# Patient Record
Sex: Male | Born: 1935 | Race: Black or African American | Hispanic: No | Smoking: Former smoker
Health system: Southern US, Community
[De-identification: ages and names within clinical notes are randomized; demographics above are authoritative.]

## PROBLEM LIST (undated history)

## (undated) DIAGNOSIS — I219 Acute myocardial infarction, unspecified: Secondary | ICD-10-CM

## (undated) DIAGNOSIS — F528 Other sexual dysfunction not due to a substance or known physiological condition: Secondary | ICD-10-CM

## (undated) DIAGNOSIS — I10 Essential (primary) hypertension: Secondary | ICD-10-CM

## (undated) DIAGNOSIS — N186 End stage renal disease: Secondary | ICD-10-CM

## (undated) DIAGNOSIS — E039 Hypothyroidism, unspecified: Secondary | ICD-10-CM

## (undated) DIAGNOSIS — D539 Nutritional anemia, unspecified: Secondary | ICD-10-CM

## (undated) DIAGNOSIS — M1990 Unspecified osteoarthritis, unspecified site: Secondary | ICD-10-CM

## (undated) DIAGNOSIS — I2510 Atherosclerotic heart disease of native coronary artery without angina pectoris: Secondary | ICD-10-CM

## (undated) DIAGNOSIS — M159 Polyosteoarthritis, unspecified: Secondary | ICD-10-CM

## (undated) DIAGNOSIS — M549 Dorsalgia, unspecified: Secondary | ICD-10-CM

## (undated) DIAGNOSIS — Z9189 Other specified personal risk factors, not elsewhere classified: Secondary | ICD-10-CM

## (undated) HISTORY — DX: Essential (primary) hypertension: I10

## (undated) HISTORY — DX: Polyosteoarthritis, unspecified: M15.9

## (undated) HISTORY — DX: Unspecified osteoarthritis, unspecified site: M19.90

## (undated) HISTORY — DX: Atherosclerotic heart disease of native coronary artery without angina pectoris: I25.10

## (undated) HISTORY — PX: AV FISTULA PLACEMENT: SHX1204

## (undated) HISTORY — PX: OTHER SURGICAL HISTORY: SHX169

## (undated) HISTORY — DX: Acute myocardial infarction, unspecified: I21.9

## (undated) HISTORY — PX: CORONARY ARTERY BYPASS GRAFT: SHX141

## (undated) HISTORY — DX: Nutritional anemia, unspecified: D53.9

## (undated) HISTORY — DX: End stage renal disease: N18.6

## (undated) HISTORY — DX: Hypothyroidism, unspecified: E03.9

## (undated) HISTORY — DX: Other specified personal risk factors, not elsewhere classified: Z91.89

## (undated) HISTORY — DX: Other sexual dysfunction not due to a substance or known physiological condition: F52.8

---

## 1962-08-10 DIAGNOSIS — I219 Acute myocardial infarction, unspecified: Secondary | ICD-10-CM

## 1962-08-10 HISTORY — DX: Acute myocardial infarction, unspecified: I21.9

## 2002-08-22 ENCOUNTER — Encounter: Payer: Self-pay | Admitting: Emergency Medicine

## 2002-08-22 ENCOUNTER — Emergency Department (HOSPITAL_COMMUNITY): Admission: EM | Admit: 2002-08-22 | Discharge: 2002-08-22 | Payer: Self-pay | Admitting: Emergency Medicine

## 2002-09-15 ENCOUNTER — Encounter: Admission: RE | Admit: 2002-09-15 | Discharge: 2002-09-15 | Payer: Self-pay | Admitting: Nephrology

## 2002-09-15 ENCOUNTER — Encounter: Payer: Self-pay | Admitting: Nephrology

## 2002-09-30 ENCOUNTER — Emergency Department (HOSPITAL_COMMUNITY): Admission: EM | Admit: 2002-09-30 | Discharge: 2002-09-30 | Payer: Self-pay | Admitting: *Deleted

## 2002-09-30 ENCOUNTER — Encounter: Payer: Self-pay | Admitting: *Deleted

## 2002-10-09 ENCOUNTER — Ambulatory Visit (HOSPITAL_COMMUNITY): Admission: RE | Admit: 2002-10-09 | Discharge: 2002-10-09 | Payer: Self-pay | Admitting: Cardiology

## 2002-10-09 ENCOUNTER — Encounter: Payer: Self-pay | Admitting: Cardiology

## 2002-10-24 ENCOUNTER — Encounter: Payer: Self-pay | Admitting: Emergency Medicine

## 2002-10-24 ENCOUNTER — Emergency Department (HOSPITAL_COMMUNITY): Admission: EM | Admit: 2002-10-24 | Discharge: 2002-10-24 | Payer: Self-pay | Admitting: Emergency Medicine

## 2003-01-14 ENCOUNTER — Emergency Department (HOSPITAL_COMMUNITY): Admission: EM | Admit: 2003-01-14 | Discharge: 2003-01-14 | Payer: Self-pay | Admitting: Emergency Medicine

## 2003-01-24 ENCOUNTER — Ambulatory Visit (HOSPITAL_COMMUNITY): Admission: RE | Admit: 2003-01-24 | Discharge: 2003-01-24 | Payer: Self-pay | Admitting: Nephrology

## 2003-01-24 ENCOUNTER — Encounter: Payer: Self-pay | Admitting: Nephrology

## 2003-07-09 ENCOUNTER — Ambulatory Visit (HOSPITAL_COMMUNITY): Admission: RE | Admit: 2003-07-09 | Discharge: 2003-07-09 | Payer: Self-pay | Admitting: Vascular Surgery

## 2003-11-01 ENCOUNTER — Ambulatory Visit (HOSPITAL_COMMUNITY): Admission: RE | Admit: 2003-11-01 | Discharge: 2003-11-01 | Payer: Self-pay | Admitting: Nephrology

## 2003-12-07 ENCOUNTER — Inpatient Hospital Stay (HOSPITAL_COMMUNITY): Admission: EM | Admit: 2003-12-07 | Discharge: 2003-12-11 | Payer: Self-pay

## 2003-12-10 ENCOUNTER — Encounter: Payer: Self-pay | Admitting: Cardiology

## 2003-12-26 ENCOUNTER — Emergency Department (HOSPITAL_COMMUNITY): Admission: EM | Admit: 2003-12-26 | Discharge: 2003-12-26 | Payer: Self-pay | Admitting: Emergency Medicine

## 2004-01-02 ENCOUNTER — Ambulatory Visit (HOSPITAL_COMMUNITY): Admission: RE | Admit: 2004-01-02 | Discharge: 2004-01-02 | Payer: Self-pay | Admitting: Vascular Surgery

## 2004-03-04 ENCOUNTER — Emergency Department (HOSPITAL_COMMUNITY): Admission: EM | Admit: 2004-03-04 | Discharge: 2004-03-04 | Payer: Self-pay | Admitting: Emergency Medicine

## 2004-07-28 ENCOUNTER — Emergency Department (HOSPITAL_COMMUNITY): Admission: EM | Admit: 2004-07-28 | Discharge: 2004-07-28 | Payer: Self-pay | Admitting: Emergency Medicine

## 2005-05-29 ENCOUNTER — Emergency Department (HOSPITAL_COMMUNITY): Admission: EM | Admit: 2005-05-29 | Discharge: 2005-05-29 | Payer: Self-pay | Admitting: Emergency Medicine

## 2005-12-05 ENCOUNTER — Emergency Department (HOSPITAL_COMMUNITY): Admission: EM | Admit: 2005-12-05 | Discharge: 2005-12-05 | Payer: Self-pay | Admitting: Family Medicine

## 2005-12-13 ENCOUNTER — Inpatient Hospital Stay (HOSPITAL_COMMUNITY): Admission: AC | Admit: 2005-12-13 | Discharge: 2005-12-28 | Payer: Self-pay

## 2006-01-01 ENCOUNTER — Ambulatory Visit (HOSPITAL_COMMUNITY): Admission: RE | Admit: 2006-01-01 | Discharge: 2006-01-01 | Payer: Self-pay | Admitting: Nephrology

## 2006-01-29 ENCOUNTER — Ambulatory Visit (HOSPITAL_COMMUNITY): Admission: RE | Admit: 2006-01-29 | Discharge: 2006-01-29 | Payer: Self-pay | Admitting: Vascular Surgery

## 2006-02-10 ENCOUNTER — Emergency Department (HOSPITAL_COMMUNITY): Admission: EM | Admit: 2006-02-10 | Discharge: 2006-02-10 | Payer: Self-pay | Admitting: Emergency Medicine

## 2007-04-04 ENCOUNTER — Encounter: Admission: RE | Admit: 2007-04-04 | Discharge: 2007-04-04 | Payer: Self-pay | Admitting: Nephrology

## 2007-04-07 ENCOUNTER — Ambulatory Visit (HOSPITAL_COMMUNITY): Admission: RE | Admit: 2007-04-07 | Discharge: 2007-04-07 | Payer: Self-pay | Admitting: Cardiology

## 2007-04-07 ENCOUNTER — Other Ambulatory Visit: Payer: Self-pay | Admitting: Cardiology

## 2007-07-06 ENCOUNTER — Emergency Department (HOSPITAL_COMMUNITY): Admission: EM | Admit: 2007-07-06 | Discharge: 2007-07-06 | Payer: Self-pay | Admitting: Emergency Medicine

## 2007-07-20 ENCOUNTER — Emergency Department (HOSPITAL_COMMUNITY): Admission: EM | Admit: 2007-07-20 | Discharge: 2007-07-20 | Payer: Self-pay | Admitting: Emergency Medicine

## 2007-08-22 ENCOUNTER — Ambulatory Visit (HOSPITAL_COMMUNITY): Admission: RE | Admit: 2007-08-22 | Discharge: 2007-08-22 | Payer: Self-pay | Admitting: Nephrology

## 2007-10-21 ENCOUNTER — Ambulatory Visit: Payer: Self-pay | Admitting: Internal Medicine

## 2008-01-30 ENCOUNTER — Emergency Department (HOSPITAL_COMMUNITY): Admission: EM | Admit: 2008-01-30 | Discharge: 2008-01-30 | Payer: Self-pay | Admitting: Emergency Medicine

## 2008-04-30 ENCOUNTER — Emergency Department (HOSPITAL_COMMUNITY): Admission: EM | Admit: 2008-04-30 | Discharge: 2008-04-30 | Payer: Self-pay | Admitting: Emergency Medicine

## 2008-06-06 ENCOUNTER — Inpatient Hospital Stay (HOSPITAL_BASED_OUTPATIENT_CLINIC_OR_DEPARTMENT_OTHER): Admission: RE | Admit: 2008-06-06 | Discharge: 2008-06-06 | Payer: Self-pay | Admitting: Cardiology

## 2008-06-27 ENCOUNTER — Emergency Department (HOSPITAL_COMMUNITY): Admission: EM | Admit: 2008-06-27 | Discharge: 2008-06-27 | Payer: Self-pay | Admitting: Emergency Medicine

## 2008-07-11 ENCOUNTER — Emergency Department (HOSPITAL_COMMUNITY): Admission: EM | Admit: 2008-07-11 | Discharge: 2008-07-11 | Payer: Self-pay | Admitting: Emergency Medicine

## 2008-07-18 ENCOUNTER — Ambulatory Visit: Payer: Self-pay | Admitting: Vascular Surgery

## 2008-07-25 ENCOUNTER — Emergency Department (HOSPITAL_COMMUNITY): Admission: EM | Admit: 2008-07-25 | Discharge: 2008-07-25 | Payer: Self-pay | Admitting: Family Medicine

## 2008-07-25 ENCOUNTER — Ambulatory Visit (HOSPITAL_COMMUNITY): Admission: RE | Admit: 2008-07-25 | Discharge: 2008-07-25 | Payer: Self-pay | Admitting: Vascular Surgery

## 2008-07-29 ENCOUNTER — Emergency Department (HOSPITAL_COMMUNITY): Admission: EM | Admit: 2008-07-29 | Discharge: 2008-07-29 | Payer: Self-pay | Admitting: Emergency Medicine

## 2008-07-30 ENCOUNTER — Ambulatory Visit (HOSPITAL_COMMUNITY): Admission: RE | Admit: 2008-07-30 | Discharge: 2008-07-30 | Payer: Self-pay | Admitting: Vascular Surgery

## 2008-07-30 ENCOUNTER — Ambulatory Visit: Payer: Self-pay | Admitting: Vascular Surgery

## 2008-08-03 ENCOUNTER — Emergency Department (HOSPITAL_COMMUNITY): Admission: EM | Admit: 2008-08-03 | Discharge: 2008-08-03 | Payer: Self-pay | Admitting: Emergency Medicine

## 2008-08-08 ENCOUNTER — Inpatient Hospital Stay (HOSPITAL_COMMUNITY): Admission: EM | Admit: 2008-08-08 | Discharge: 2008-08-09 | Payer: Self-pay | Admitting: Emergency Medicine

## 2008-09-05 ENCOUNTER — Ambulatory Visit: Payer: Self-pay | Admitting: Vascular Surgery

## 2008-09-19 ENCOUNTER — Ambulatory Visit: Payer: Self-pay | Admitting: Vascular Surgery

## 2008-10-15 ENCOUNTER — Ambulatory Visit (HOSPITAL_COMMUNITY): Admission: RE | Admit: 2008-10-15 | Discharge: 2008-10-15 | Payer: Self-pay | Admitting: Vascular Surgery

## 2008-10-15 ENCOUNTER — Ambulatory Visit: Payer: Self-pay | Admitting: Vascular Surgery

## 2008-10-31 ENCOUNTER — Emergency Department (HOSPITAL_COMMUNITY): Admission: EM | Admit: 2008-10-31 | Discharge: 2008-11-01 | Payer: Self-pay | Admitting: Emergency Medicine

## 2008-11-02 ENCOUNTER — Encounter: Payer: Self-pay | Admitting: Internal Medicine

## 2008-11-16 ENCOUNTER — Encounter: Payer: Self-pay | Admitting: Internal Medicine

## 2008-11-22 DIAGNOSIS — N186 End stage renal disease: Secondary | ICD-10-CM

## 2008-11-22 DIAGNOSIS — M1990 Unspecified osteoarthritis, unspecified site: Secondary | ICD-10-CM | POA: Insufficient documentation

## 2008-11-22 DIAGNOSIS — F528 Other sexual dysfunction not due to a substance or known physiological condition: Secondary | ICD-10-CM

## 2008-11-22 DIAGNOSIS — Z8639 Personal history of other endocrine, nutritional and metabolic disease: Secondary | ICD-10-CM

## 2008-11-22 DIAGNOSIS — M159 Polyosteoarthritis, unspecified: Secondary | ICD-10-CM

## 2008-11-22 DIAGNOSIS — Z9189 Other specified personal risk factors, not elsewhere classified: Secondary | ICD-10-CM

## 2008-11-22 DIAGNOSIS — I2510 Atherosclerotic heart disease of native coronary artery without angina pectoris: Secondary | ICD-10-CM | POA: Insufficient documentation

## 2008-11-22 DIAGNOSIS — D539 Nutritional anemia, unspecified: Secondary | ICD-10-CM | POA: Insufficient documentation

## 2008-11-22 DIAGNOSIS — I10 Essential (primary) hypertension: Secondary | ICD-10-CM | POA: Insufficient documentation

## 2008-11-22 DIAGNOSIS — Z862 Personal history of diseases of the blood and blood-forming organs and certain disorders involving the immune mechanism: Secondary | ICD-10-CM

## 2008-11-22 DIAGNOSIS — E039 Hypothyroidism, unspecified: Secondary | ICD-10-CM | POA: Insufficient documentation

## 2008-11-26 ENCOUNTER — Ambulatory Visit: Payer: Self-pay | Admitting: Internal Medicine

## 2008-12-07 ENCOUNTER — Emergency Department (HOSPITAL_COMMUNITY): Admission: EM | Admit: 2008-12-07 | Discharge: 2008-12-07 | Payer: Self-pay | Admitting: Emergency Medicine

## 2008-12-14 ENCOUNTER — Ambulatory Visit: Payer: Self-pay | Admitting: Internal Medicine

## 2008-12-14 DIAGNOSIS — R0609 Other forms of dyspnea: Secondary | ICD-10-CM

## 2008-12-14 DIAGNOSIS — R0989 Other specified symptoms and signs involving the circulatory and respiratory systems: Secondary | ICD-10-CM

## 2009-01-09 ENCOUNTER — Emergency Department (HOSPITAL_COMMUNITY): Admission: EM | Admit: 2009-01-09 | Discharge: 2009-01-09 | Payer: Self-pay | Admitting: Emergency Medicine

## 2009-03-31 ENCOUNTER — Emergency Department (HOSPITAL_COMMUNITY): Admission: EM | Admit: 2009-03-31 | Discharge: 2009-03-31 | Payer: Self-pay | Admitting: Emergency Medicine

## 2009-04-01 ENCOUNTER — Emergency Department (HOSPITAL_COMMUNITY): Admission: EM | Admit: 2009-04-01 | Discharge: 2009-04-01 | Payer: Self-pay | Admitting: Emergency Medicine

## 2009-04-08 ENCOUNTER — Emergency Department (HOSPITAL_COMMUNITY): Admission: EM | Admit: 2009-04-08 | Discharge: 2009-04-08 | Payer: Self-pay | Admitting: Emergency Medicine

## 2009-05-01 ENCOUNTER — Ambulatory Visit (HOSPITAL_COMMUNITY): Admission: RE | Admit: 2009-05-01 | Discharge: 2009-05-01 | Payer: Self-pay | Admitting: Nephrology

## 2009-08-11 ENCOUNTER — Emergency Department (HOSPITAL_COMMUNITY): Admission: EM | Admit: 2009-08-11 | Discharge: 2009-08-11 | Payer: Self-pay | Admitting: Emergency Medicine

## 2009-08-31 ENCOUNTER — Inpatient Hospital Stay (HOSPITAL_COMMUNITY): Admission: EM | Admit: 2009-08-31 | Discharge: 2009-09-05 | Payer: Self-pay | Admitting: Emergency Medicine

## 2009-10-09 ENCOUNTER — Emergency Department (HOSPITAL_COMMUNITY): Admission: EM | Admit: 2009-10-09 | Discharge: 2009-10-09 | Payer: Self-pay | Admitting: Emergency Medicine

## 2009-10-13 ENCOUNTER — Observation Stay (HOSPITAL_COMMUNITY): Admission: EM | Admit: 2009-10-13 | Discharge: 2009-10-15 | Payer: Self-pay | Admitting: Emergency Medicine

## 2009-11-10 ENCOUNTER — Emergency Department (HOSPITAL_COMMUNITY): Admission: EM | Admit: 2009-11-10 | Discharge: 2009-11-11 | Payer: Self-pay | Admitting: Emergency Medicine

## 2010-03-25 ENCOUNTER — Emergency Department (HOSPITAL_COMMUNITY): Admission: EM | Admit: 2010-03-25 | Discharge: 2010-03-25 | Payer: Self-pay | Admitting: Emergency Medicine

## 2010-04-21 ENCOUNTER — Emergency Department (HOSPITAL_COMMUNITY): Admission: EM | Admit: 2010-04-21 | Discharge: 2010-04-21 | Payer: Self-pay | Admitting: Emergency Medicine

## 2010-07-03 ENCOUNTER — Emergency Department (HOSPITAL_COMMUNITY): Admission: EM | Admit: 2010-07-03 | Discharge: 2010-07-03 | Payer: Self-pay | Admitting: Emergency Medicine

## 2010-07-06 ENCOUNTER — Emergency Department (HOSPITAL_COMMUNITY): Admission: EM | Admit: 2010-07-06 | Discharge: 2010-07-06 | Payer: Self-pay | Admitting: Emergency Medicine

## 2010-08-23 ENCOUNTER — Emergency Department (HOSPITAL_COMMUNITY)
Admission: EM | Admit: 2010-08-23 | Discharge: 2010-08-23 | Payer: Self-pay | Source: Home / Self Care | Admitting: Emergency Medicine

## 2010-09-10 ENCOUNTER — Emergency Department (HOSPITAL_COMMUNITY): Payer: Medicaid Other

## 2010-09-10 ENCOUNTER — Emergency Department (HOSPITAL_COMMUNITY): Payer: Medicare Other

## 2010-09-10 ENCOUNTER — Inpatient Hospital Stay (HOSPITAL_COMMUNITY)
Admission: EM | Admit: 2010-09-10 | Discharge: 2010-09-16 | DRG: 492 | Disposition: A | Discharge status: Skilled Nursing Facility | Payer: Medicare Other | Attending: Internal Medicine | Admitting: Internal Medicine

## 2010-09-10 DIAGNOSIS — S82109A Unspecified fracture of upper end of unspecified tibia, initial encounter for closed fracture: Principal | ICD-10-CM | POA: Diagnosis present

## 2010-09-10 DIAGNOSIS — Y998 Other external cause status: Secondary | ICD-10-CM

## 2010-09-10 DIAGNOSIS — E875 Hyperkalemia: Secondary | ICD-10-CM | POA: Diagnosis present

## 2010-09-10 DIAGNOSIS — E039 Hypothyroidism, unspecified: Secondary | ICD-10-CM | POA: Diagnosis present

## 2010-09-10 DIAGNOSIS — R031 Nonspecific low blood-pressure reading: Secondary | ICD-10-CM | POA: Diagnosis present

## 2010-09-10 DIAGNOSIS — I2510 Atherosclerotic heart disease of native coronary artery without angina pectoris: Secondary | ICD-10-CM | POA: Diagnosis present

## 2010-09-10 DIAGNOSIS — M25469 Effusion, unspecified knee: Secondary | ICD-10-CM | POA: Diagnosis present

## 2010-09-10 DIAGNOSIS — N2581 Secondary hyperparathyroidism of renal origin: Secondary | ICD-10-CM | POA: Diagnosis present

## 2010-09-10 DIAGNOSIS — N186 End stage renal disease: Secondary | ICD-10-CM | POA: Diagnosis present

## 2010-09-10 DIAGNOSIS — Z992 Dependence on renal dialysis: Secondary | ICD-10-CM

## 2010-09-10 DIAGNOSIS — D62 Acute posthemorrhagic anemia: Secondary | ICD-10-CM | POA: Diagnosis not present

## 2010-09-10 DIAGNOSIS — S8010XA Contusion of unspecified lower leg, initial encounter: Secondary | ICD-10-CM | POA: Diagnosis present

## 2010-09-10 DIAGNOSIS — IMO0002 Reserved for concepts with insufficient information to code with codable children: Secondary | ICD-10-CM

## 2010-09-10 DIAGNOSIS — S060X0A Concussion without loss of consciousness, initial encounter: Secondary | ICD-10-CM | POA: Diagnosis present

## 2010-09-10 DIAGNOSIS — Z951 Presence of aortocoronary bypass graft: Secondary | ICD-10-CM

## 2010-09-10 DIAGNOSIS — I120 Hypertensive chronic kidney disease with stage 5 chronic kidney disease or end stage renal disease: Secondary | ICD-10-CM | POA: Diagnosis present

## 2010-09-10 DIAGNOSIS — K219 Gastro-esophageal reflux disease without esophagitis: Secondary | ICD-10-CM | POA: Diagnosis present

## 2010-09-10 DIAGNOSIS — I959 Hypotension, unspecified: Secondary | ICD-10-CM | POA: Diagnosis present

## 2010-09-10 LAB — CBC
MCH: 31.3 pg (ref 26.0–34.0)
MCV: 97.1 fL (ref 78.0–100.0)
Platelets: 184 10*3/uL (ref 150–400)
RBC: 3.1 MIL/uL — ABNORMAL LOW (ref 4.22–5.81)
RDW: 13.6 % (ref 11.5–15.5)
WBC: 6.5 10*3/uL (ref 4.0–10.5)

## 2010-09-10 LAB — BASIC METABOLIC PANEL
BUN: 40 mg/dL — ABNORMAL HIGH (ref 6–23)
CO2: 25 mEq/L (ref 19–32)
Chloride: 102 mEq/L (ref 96–112)
GFR calc non Af Amer: 5 mL/min — ABNORMAL LOW (ref >60)
Glucose, Bld: 111 mg/dL — ABNORMAL HIGH (ref 70–99)
Potassium: 5.3 mEq/L — ABNORMAL HIGH (ref 3.5–5.1)
Sodium: 140 mEq/L (ref 135–145)

## 2010-09-10 LAB — ABO/RH: ABO/RH(D): O POS

## 2010-09-10 LAB — APTT: aPTT: 33 seconds (ref 24–37)

## 2010-09-11 ENCOUNTER — Emergency Department (HOSPITAL_COMMUNITY): Payer: Medicare Other

## 2010-09-11 ENCOUNTER — Inpatient Hospital Stay (HOSPITAL_COMMUNITY): Payer: Medicare Other

## 2010-09-11 LAB — CBC
Hemoglobin: 7.7 g/dL — ABNORMAL LOW (ref 13.0–17.0)
Hemoglobin: 7.7 g/dL — ABNORMAL LOW (ref 13.0–17.0)
MCHC: 33.2 g/dL (ref 30.0–36.0)
MCV: 95 fL (ref 78.0–100.0)
Platelets: 146 10*3/uL — ABNORMAL LOW (ref 150–400)
RBC: 2.4 MIL/uL — ABNORMAL LOW (ref 4.22–5.81)
RDW: 13.7 % (ref 11.5–15.5)
WBC: 11.2 10*3/uL — ABNORMAL HIGH (ref 4.0–10.5)
WBC: 9.8 10*3/uL (ref 4.0–10.5)

## 2010-09-11 LAB — POTASSIUM: Potassium: 5.3 mEq/L — ABNORMAL HIGH (ref 3.5–5.1)

## 2010-09-11 LAB — BASIC METABOLIC PANEL
Calcium: 7.8 mg/dL — ABNORMAL LOW (ref 8.4–10.5)
GFR calc non Af Amer: 4 mL/min — ABNORMAL LOW (ref >60)
Glucose, Bld: 111 mg/dL — ABNORMAL HIGH (ref 70–99)
Sodium: 138 mEq/L (ref 135–145)

## 2010-09-11 LAB — PROTIME-INR: Prothrombin Time: 15 seconds (ref 11.6–15.2)

## 2010-09-11 LAB — MRSA PCR SCREENING: MRSA by PCR: POSITIVE — AB

## 2010-09-11 LAB — GLUCOSE, CAPILLARY: Glucose-Capillary: 72 mg/dL (ref 70–99)

## 2010-09-11 MED ORDER — IOHEXOL 300 MG/ML  SOLN
100.0000 mL | Freq: Once | INTRAMUSCULAR | Status: AC | PRN
  Administered 2010-09-11: 100 mL via INTRAVENOUS
  Filled 2010-09-11: qty 100

## 2010-09-12 ENCOUNTER — Inpatient Hospital Stay (HOSPITAL_COMMUNITY): Payer: Medicare Other

## 2010-09-12 LAB — PROTIME-INR
INR: 1.19 (ref 0.00–1.49)
Prothrombin Time: 15.3 seconds — ABNORMAL HIGH (ref 11.6–15.2)

## 2010-09-12 LAB — CBC
MCH: 30.7 pg (ref 26.0–34.0)
Platelets: 123 10*3/uL — ABNORMAL LOW (ref 150–400)
RBC: 2.64 MIL/uL — ABNORMAL LOW (ref 4.22–5.81)
WBC: 9.8 10*3/uL (ref 4.0–10.5)

## 2010-09-12 LAB — RENAL FUNCTION PANEL
Calcium: 8.3 mg/dL — ABNORMAL LOW (ref 8.4–10.5)
Glucose, Bld: 81 mg/dL (ref 70–99)
Phosphorus: 6.1 mg/dL — ABNORMAL HIGH (ref 2.3–4.6)
Sodium: 138 mEq/L (ref 135–145)

## 2010-09-13 ENCOUNTER — Inpatient Hospital Stay (HOSPITAL_COMMUNITY): Payer: Medicare Other

## 2010-09-13 LAB — CBC
MCH: 30.7 pg (ref 26.0–34.0)
MCV: 90.4 fL (ref 78.0–100.0)
Platelets: 138 10*3/uL — ABNORMAL LOW (ref 150–400)
RDW: 16.3 % — ABNORMAL HIGH (ref 11.5–15.5)
WBC: 8.3 10*3/uL (ref 4.0–10.5)

## 2010-09-13 LAB — RENAL FUNCTION PANEL
Albumin: 2.7 g/dL — ABNORMAL LOW (ref 3.5–5.2)
BUN: 30 mg/dL — ABNORMAL HIGH (ref 6–23)
Creatinine, Ser: 10.14 mg/dL — ABNORMAL HIGH (ref 0.4–1.5)
Phosphorus: 4.7 mg/dL — ABNORMAL HIGH (ref 2.3–4.6)

## 2010-09-14 LAB — TYPE AND SCREEN
ABO/RH(D): O POS
Unit division: 0
Unit division: 0

## 2010-09-15 ENCOUNTER — Ambulatory Visit
Payer: Medicare Other | Attending: Rehabilitative and Restorative Service Providers" | Admitting: Rehabilitative and Restorative Service Providers"

## 2010-09-15 LAB — RENAL FUNCTION PANEL
BUN: 38 mg/dL — ABNORMAL HIGH (ref 6–23)
Calcium: 8.4 mg/dL (ref 8.4–10.5)
Glucose, Bld: 94 mg/dL (ref 70–99)
Phosphorus: 5.4 mg/dL — ABNORMAL HIGH (ref 2.3–4.6)

## 2010-09-15 LAB — CBC
HCT: 26.5 % — ABNORMAL LOW (ref 39.0–52.0)
MCHC: 33.2 g/dL (ref 30.0–36.0)
MCV: 91.4 fL (ref 78.0–100.0)
RDW: 15.6 % — ABNORMAL HIGH (ref 11.5–15.5)

## 2010-09-15 LAB — POCT I-STAT 4, (NA,K, GLUC, HGB,HCT)
Hemoglobin: 7.8 g/dL — ABNORMAL LOW (ref 13.0–17.0)
Potassium: 5.4 meq/L — ABNORMAL HIGH (ref 3.5–5.1)

## 2010-09-16 ENCOUNTER — Inpatient Hospital Stay (HOSPITAL_COMMUNITY): Payer: Medicare Other

## 2010-09-16 LAB — BASIC METABOLIC PANEL
GFR calc Af Amer: 5 mL/min — ABNORMAL LOW (ref >60)
GFR calc non Af Amer: 4 mL/min — ABNORMAL LOW (ref >60)
Glucose, Bld: 106 mg/dL — ABNORMAL HIGH (ref 70–99)
Potassium: 4.7 mEq/L (ref 3.5–5.1)
Sodium: 134 mEq/L — ABNORMAL LOW (ref 135–145)

## 2010-09-16 LAB — CBC
HCT: 25 % — ABNORMAL LOW (ref 39.0–52.0)
Hemoglobin: 8.4 g/dL — ABNORMAL LOW (ref 13.0–17.0)
RDW: 15.1 % (ref 11.5–15.5)
WBC: 8.4 10*3/uL (ref 4.0–10.5)

## 2010-09-16 LAB — IRON AND TIBC
Saturation Ratios: 13 % — ABNORMAL LOW (ref 20–55)
TIBC: 160 ug/dL — ABNORMAL LOW (ref 215–435)
UIBC: 140 ug/dL
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
## 2010-09-25 ENCOUNTER — Encounter: Payer: Self-pay | Admitting: Gastroenterology

## 2010-10-01 NOTE — Letter (Signed)
Summary: New Patient letter  [facility]  [address] [place], [place] [address]   Phone: [phone]  Fax: [phone]       09/25/2010 MRN: [id number]  Jared [name] [address] [place], [place]  [address]  Dear Mr. Jared [name],  Welcome to the Gastroenterology Division at [facility].    You are scheduled to see Dr.  Christella Hartigan on 11-07-10 at 10:15am on the 3rd floor at [facility], [address]  We ask that you try to arrive at our office 15 minutes prior to your appointment time to allow for check-in.  We would like you to complete the enclosed self-administered evaluation form prior to your visit and bring it with you on the day of your appointment.  We will review it with you.  Also, please bring a complete list of all your medications or, if you prefer, bring the medication bottles and we will list them.  Please bring your insurance card so that we may make a copy of it.  If your insurance requires a referral to see a specialist, please bring your referral form from your primary care physician.  Co-payments are due at the time of your visit and may be paid by cash, check or credit card.     Your office visit will consist of a consult with your physician (includes a physical exam), any laboratory testing he/she may order, scheduling of any necessary diagnostic testing (e.g. x-ray, ultrasound, CT-scan), and scheduling of a procedure (e.g. Endoscopy, Colonoscopy) if required.  Please allow enough time on your schedule to allow for any/all of these possibilities.    If you cannot keep your appointment, please call [phone] to cancel or reschedule prior to your appointment date.  This allows [place] the opportunity to schedule an appointment for another patient in need of care.  If you do not cancel or reschedule by 5 p.m. the business day prior to your appointment date, you will be charged a $50.00 late cancellation/no-show fee.    Thank you for choosing [facility] for your medical needs.  We appreciate the opportunity to care for you.  Please visit [place] at our website  to learn more about our practice.                     Sincerely,                                                             The Gastroenterology Division
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
## 2010-10-21 LAB — POCT CARDIAC MARKERS
CKMB, poc: 1.3 ng/mL (ref 1.0–8.0)
CKMB, poc: 2 ng/mL (ref 1.0–8.0)
Troponin i, poc: <0.05 ng/mL (ref 0.00–0.09)
Troponin i, poc: <0.05 ng/mL (ref 0.00–0.09)

## 2010-10-21 LAB — POCT I-STAT, CHEM 8
BUN: 33 mg/dL — ABNORMAL HIGH (ref 6–23)
Calcium, Ion: 1.09 mmol/L — ABNORMAL LOW (ref 1.12–1.32)
Chloride: 100 mEq/L (ref 96–112)
Chloride: 102 mEq/L (ref 96–112)
Glucose, Bld: 71 mg/dL (ref 70–99)
HCT: 38 % — ABNORMAL LOW (ref 39.0–52.0)
Hemoglobin: 12.9 g/dL — ABNORMAL LOW (ref 13.0–17.0)
Potassium: 4.4 mEq/L (ref 3.5–5.1)
Potassium: 4.7 mEq/L (ref 3.5–5.1)
Sodium: 140 mEq/L (ref 135–145)

## 2010-10-21 LAB — DIFFERENTIAL
Basophils Absolute: 0.1 10*3/uL (ref 0.0–0.1)
Basophils Relative: 1 % (ref 0–1)
Eosinophils Absolute: 0.4 10*3/uL (ref 0.0–0.7)
Neutro Abs: 3.2 10*3/uL (ref 1.7–7.7)
Neutrophils Relative %: 58 % (ref 43–77)

## 2010-10-21 LAB — CBC
Hemoglobin: 11.9 g/dL — ABNORMAL LOW (ref 13.0–17.0)
MCHC: 32.9 g/dL (ref 30.0–36.0)
Platelets: 177 10*3/uL (ref 150–400)
RDW: 14.2 % (ref 11.5–15.5)

## 2010-10-26 LAB — POCT I-STAT, CHEM 8
BUN: 30 mg/dL — ABNORMAL HIGH (ref 6–23)
Calcium, Ion: 0.97 mmol/L — ABNORMAL LOW (ref 1.12–1.32)
Chloride: 100 mEq/L (ref 96–112)
HCT: 40 % (ref 39.0–52.0)
Sodium: 140 mEq/L (ref 135–145)
TCO2: 36 mmol/L (ref 0–100)

## 2010-10-26 LAB — BASIC METABOLIC PANEL
BUN: 25 mg/dL — ABNORMAL HIGH (ref 6–23)
BUN: 38 mg/dL — ABNORMAL HIGH (ref 6–23)
CO2: 25 mEq/L (ref 19–32)
CO2: 28 mEq/L (ref 19–32)
Chloride: 100 mEq/L (ref 96–112)
Chloride: 95 mEq/L — ABNORMAL LOW (ref 96–112)
Chloride: 97 mEq/L (ref 96–112)
Creatinine, Ser: 12.85 mg/dL — ABNORMAL HIGH (ref 0.4–1.5)
Creatinine, Ser: 8.94 mg/dL — ABNORMAL HIGH (ref 0.4–1.5)
GFR calc Af Amer: 4 mL/min — ABNORMAL LOW (ref >60)
Glucose, Bld: 105 mg/dL — ABNORMAL HIGH (ref 70–99)
Glucose, Bld: 83 mg/dL (ref 70–99)
Sodium: 138 mEq/L (ref 135–145)

## 2010-10-26 LAB — URINALYSIS, ROUTINE W REFLEX MICROSCOPIC
Glucose, UA: NEGATIVE mg/dL
Ketones, ur: NEGATIVE mg/dL
Leukocytes, UA: NEGATIVE
Nitrite: NEGATIVE
Specific Gravity, Urine: 1.019 (ref 1.005–1.030)
pH: 8 (ref 5.0–8.0)

## 2010-10-26 LAB — CBC
HCT: 33.4 % — ABNORMAL LOW (ref 39.0–52.0)
HCT: 33.8 % — ABNORMAL LOW (ref 39.0–52.0)
HCT: 36 % — ABNORMAL LOW (ref 39.0–52.0)
Hemoglobin: 11 g/dL — ABNORMAL LOW (ref 13.0–17.0)
Hemoglobin: 12.3 g/dL — ABNORMAL LOW (ref 13.0–17.0)
Hemoglobin: 12.4 g/dL — ABNORMAL LOW (ref 13.0–17.0)
MCHC: 33.1 g/dL (ref 30.0–36.0)
MCHC: 33.8 g/dL (ref 30.0–36.0)
MCHC: 34.1 g/dL (ref 30.0–36.0)
MCV: 102.2 fL — ABNORMAL HIGH (ref 78.0–100.0)
MCV: 103 fL — ABNORMAL HIGH (ref 78.0–100.0)
MCV: 103 fL — ABNORMAL HIGH (ref 78.0–100.0)
MCV: 103.9 fL — ABNORMAL HIGH (ref 78.0–100.0)
MCV: 104.2 fL — ABNORMAL HIGH (ref 78.0–100.0)
Platelets: 157 10*3/uL (ref 150–400)
Platelets: 175 10*3/uL (ref 150–400)
Platelets: 175 10*3/uL (ref 150–400)
Platelets: 176 10*3/uL (ref 150–400)
RBC: 3.21 MIL/uL — ABNORMAL LOW (ref 4.22–5.81)
RBC: 3.24 MIL/uL — ABNORMAL LOW (ref 4.22–5.81)
RBC: 3.55 MIL/uL — ABNORMAL LOW (ref 4.22–5.81)
RDW: 14.8 % (ref 11.5–15.5)
RDW: 15 % (ref 11.5–15.5)
WBC: 6.2 10*3/uL (ref 4.0–10.5)
WBC: 6.7 10*3/uL (ref 4.0–10.5)
WBC: 9.6 10*3/uL (ref 4.0–10.5)

## 2010-10-26 LAB — DIFFERENTIAL
Basophils Absolute: 0 10*3/uL (ref 0.0–0.1)
Basophils Relative: 0 % (ref 0–1)
Eosinophils Absolute: 0 10*3/uL (ref 0.0–0.7)
Lymphocytes Relative: 10 % — ABNORMAL LOW (ref 12–46)
Lymphs Abs: 1.3 10*3/uL (ref 0.7–4.0)
Monocytes Absolute: 1.2 10*3/uL — ABNORMAL HIGH (ref 0.1–1.0)
Monocytes Absolute: 1.4 10*3/uL — ABNORMAL HIGH (ref 0.1–1.0)
Monocytes Relative: 15 % — ABNORMAL HIGH (ref 3–12)
Neutro Abs: 6 10*3/uL (ref 1.7–7.7)
Neutrophils Relative %: 71 % (ref 43–77)
Neutrophils Relative %: 75 % (ref 43–77)

## 2010-10-26 LAB — POCT I-STAT 3, ART BLOOD GAS (G3+)
Bicarbonate: 27.3 mEq/L — ABNORMAL HIGH (ref 20.0–24.0)
Patient temperature: 102.9
TCO2: 28 mmol/L (ref 0–100)
pCO2 arterial: 35.8 mmHg (ref 35.0–45.0)
pH, Arterial: 7.498 — ABNORMAL HIGH (ref 7.350–7.450)
pO2, Arterial: 82 mmHg (ref 80.0–100.0)

## 2010-10-26 LAB — RENAL FUNCTION PANEL
BUN: 42 mg/dL — ABNORMAL HIGH (ref 6–23)
CO2: 28 mEq/L (ref 19–32)
Calcium: 7.6 mg/dL — ABNORMAL LOW (ref 8.4–10.5)
Calcium: 8.2 mg/dL — ABNORMAL LOW (ref 8.4–10.5)
GFR calc Af Amer: 5 mL/min — ABNORMAL LOW (ref >60)
GFR calc non Af Amer: 4 mL/min — ABNORMAL LOW (ref >60)
Glucose, Bld: 86 mg/dL (ref 70–99)
Phosphorus: 4.9 mg/dL — ABNORMAL HIGH (ref 2.3–4.6)
Potassium: 5.2 mEq/L — ABNORMAL HIGH (ref 3.5–5.1)
Sodium: 138 mEq/L (ref 135–145)
Sodium: 139 mEq/L (ref 135–145)

## 2010-10-26 LAB — COMPREHENSIVE METABOLIC PANEL
Albumin: 3.7 g/dL (ref 3.5–5.2)
Alkaline Phosphatase: 170 U/L — ABNORMAL HIGH (ref 39–117)
BUN: 32 mg/dL — ABNORMAL HIGH (ref 6–23)
Creatinine, Ser: 10 mg/dL — ABNORMAL HIGH (ref 0.4–1.5)
Glucose, Bld: 95 mg/dL (ref 70–99)
Total Bilirubin: 1.3 mg/dL — ABNORMAL HIGH (ref 0.3–1.2)
Total Protein: 7.8 g/dL (ref 6.0–8.3)

## 2010-10-26 LAB — CULTURE, BLOOD (ROUTINE X 2): Culture: NO GROWTH

## 2010-10-26 LAB — HIV ANTIBODY (ROUTINE TESTING W REFLEX)
HIV: NONREACTIVE
HIV: NONREACTIVE

## 2010-10-26 LAB — URINE MICROSCOPIC-ADD ON

## 2010-10-26 LAB — POCT CARDIAC MARKERS: Myoglobin, poc: >500 ng/mL (ref 12–200)

## 2010-10-26 LAB — POTASSIUM: Potassium: 5.7 mEq/L — ABNORMAL HIGH (ref 3.5–5.1)

## 2010-10-26 LAB — URINE CULTURE: Colony Count: 35000

## 2010-10-29 LAB — POCT I-STAT, CHEM 8
Calcium, Ion: 0.89 mmol/L — ABNORMAL LOW (ref 1.12–1.32)
Creatinine, Ser: 15.4 mg/dL — ABNORMAL HIGH (ref 0.4–1.5)
Glucose, Bld: 83 mg/dL (ref 70–99)
HCT: 38 % — ABNORMAL LOW (ref 39.0–52.0)
Sodium: 135 mEq/L (ref 135–145)

## 2010-11-02 LAB — CARDIAC PANEL(CRET KIN+CKTOT+MB+TROPI)
Relative Index: 0.9 (ref 0.0–2.5)
Total CK: 349 U/L — ABNORMAL HIGH (ref 7–232)
Troponin I: 0.14 ng/mL — ABNORMAL HIGH (ref 0.00–0.06)
Troponin I: 0.17 ng/mL — ABNORMAL HIGH (ref 0.00–0.06)

## 2010-11-02 LAB — RENAL FUNCTION PANEL
Albumin: 3.1 g/dL — ABNORMAL LOW (ref 3.5–5.2)
BUN: 30 mg/dL — ABNORMAL HIGH (ref 6–23)
Creatinine, Ser: 8.65 mg/dL — ABNORMAL HIGH (ref 0.4–1.5)
Phosphorus: 4.1 mg/dL (ref 2.3–4.6)

## 2010-11-02 LAB — CBC
HCT: 29 % — ABNORMAL LOW (ref 39.0–52.0)
HCT: 29.6 % — ABNORMAL LOW (ref 39.0–52.0)
HCT: 30.7 % — ABNORMAL LOW (ref 39.0–52.0)
HCT: 31 % — ABNORMAL LOW (ref 39.0–52.0)
Hemoglobin: 10.1 g/dL — ABNORMAL LOW (ref 13.0–17.0)
MCHC: 33.5 g/dL (ref 30.0–36.0)
MCHC: 33.5 g/dL (ref 30.0–36.0)
MCHC: 33.9 g/dL (ref 30.0–36.0)
MCV: 103 fL — ABNORMAL HIGH (ref 78.0–100.0)
MCV: 103.3 fL — ABNORMAL HIGH (ref 78.0–100.0)
Platelets: 167 10*3/uL (ref 150–400)
Platelets: 182 10*3/uL (ref 150–400)
Platelets: 204 10*3/uL (ref 150–400)
RBC: 2.79 MIL/uL — ABNORMAL LOW (ref 4.22–5.81)
RBC: 2.9 MIL/uL — ABNORMAL LOW (ref 4.22–5.81)
RDW: 14.7 % (ref 11.5–15.5)
RDW: 14.7 % (ref 11.5–15.5)
RDW: 15.6 % — ABNORMAL HIGH (ref 11.5–15.5)
WBC: 5.8 10*3/uL (ref 4.0–10.5)

## 2010-11-02 LAB — BASIC METABOLIC PANEL
BUN: 43 mg/dL — ABNORMAL HIGH (ref 6–23)
Chloride: 100 mEq/L (ref 96–112)
Creatinine, Ser: 12.11 mg/dL — ABNORMAL HIGH (ref 0.4–1.5)
Glucose, Bld: 123 mg/dL — ABNORMAL HIGH (ref 70–99)
Potassium: 5 mEq/L (ref 3.5–5.1)

## 2010-11-02 LAB — POCT I-STAT, CHEM 8
BUN: 64 mg/dL — ABNORMAL HIGH (ref 6–23)
Calcium, Ion: 1.02 mmol/L — ABNORMAL LOW (ref 1.12–1.32)
Chloride: 106 mEq/L (ref 96–112)
Glucose, Bld: 92 mg/dL (ref 70–99)
TCO2: 25 mmol/L (ref 0–100)

## 2010-11-02 LAB — CK TOTAL AND CKMB (NOT AT ARMC): Total CK: 442 U/L — ABNORMAL HIGH (ref 7–232)

## 2010-11-02 LAB — COMPREHENSIVE METABOLIC PANEL
AST: 26 U/L (ref 0–37)
Albumin: 3.1 g/dL — ABNORMAL LOW (ref 3.5–5.2)
Alkaline Phosphatase: 152 U/L — ABNORMAL HIGH (ref 39–117)
BUN: 54 mg/dL — ABNORMAL HIGH (ref 6–23)
Chloride: 98 mEq/L (ref 96–112)
Potassium: 5.1 mEq/L (ref 3.5–5.1)
Total Bilirubin: 0.5 mg/dL (ref 0.3–1.2)

## 2010-11-02 LAB — DIFFERENTIAL
Basophils Absolute: 0.1 10*3/uL (ref 0.0–0.1)
Basophils Relative: 1 % (ref 0–1)
Basophils Relative: 1 % (ref 0–1)
Eosinophils Absolute: 0.6 10*3/uL (ref 0.0–0.7)
Eosinophils Relative: 2 % (ref 0–5)
Eosinophils Relative: 8 % — ABNORMAL HIGH (ref 0–5)
Lymphocytes Relative: 12 % (ref 12–46)
Lymphs Abs: 1.5 10*3/uL (ref 0.7–4.0)
Monocytes Absolute: 0.8 10*3/uL (ref 0.1–1.0)
Monocytes Relative: 11 % (ref 3–12)
Neutro Abs: 5.7 10*3/uL (ref 1.7–7.7)

## 2010-11-02 LAB — TROPONIN I: Troponin I: 0.16 ng/mL — ABNORMAL HIGH (ref 0.00–0.06)

## 2010-11-02 LAB — POCT CARDIAC MARKERS

## 2010-11-02 LAB — IRON AND TIBC: Iron: 47 ug/dL (ref 42–135)

## 2010-11-07 ENCOUNTER — Ambulatory Visit: Payer: Medicare Other | Admitting: Gastroenterology

## 2010-11-11 ENCOUNTER — Emergency Department (HOSPITAL_COMMUNITY)
Admission: EM | Admit: 2010-11-11 | Discharge: 2010-11-11 | Disposition: A | Discharge status: Home / Self Care | Payer: Medicare Other | Attending: Emergency Medicine | Admitting: Emergency Medicine

## 2010-11-11 DIAGNOSIS — Z8639 Personal history of other endocrine, nutritional and metabolic disease: Secondary | ICD-10-CM | POA: Insufficient documentation

## 2010-11-11 DIAGNOSIS — T82898A Other specified complication of vascular prosthetic devices, implants and grafts, initial encounter: Secondary | ICD-10-CM | POA: Insufficient documentation

## 2010-11-11 DIAGNOSIS — I129 Hypertensive chronic kidney disease with stage 1 through stage 4 chronic kidney disease, or unspecified chronic kidney disease: Secondary | ICD-10-CM | POA: Insufficient documentation

## 2010-11-11 DIAGNOSIS — Z992 Dependence on renal dialysis: Secondary | ICD-10-CM | POA: Insufficient documentation

## 2010-11-11 DIAGNOSIS — R630 Anorexia: Secondary | ICD-10-CM | POA: Insufficient documentation

## 2010-11-11 DIAGNOSIS — Y841 Kidney dialysis as the cause of abnormal reaction of the patient, or of later complication, without mention of misadventure at the time of the procedure: Secondary | ICD-10-CM | POA: Insufficient documentation

## 2010-11-11 DIAGNOSIS — Z79899 Other long term (current) drug therapy: Secondary | ICD-10-CM | POA: Insufficient documentation

## 2010-11-11 DIAGNOSIS — E039 Hypothyroidism, unspecified: Secondary | ICD-10-CM | POA: Insufficient documentation

## 2010-11-11 DIAGNOSIS — Z862 Personal history of diseases of the blood and blood-forming organs and certain disorders involving the immune mechanism: Secondary | ICD-10-CM | POA: Insufficient documentation

## 2010-11-11 DIAGNOSIS — H409 Unspecified glaucoma: Secondary | ICD-10-CM | POA: Insufficient documentation

## 2010-11-11 DIAGNOSIS — Z7982 Long term (current) use of aspirin: Secondary | ICD-10-CM | POA: Insufficient documentation

## 2010-11-11 DIAGNOSIS — I2510 Atherosclerotic heart disease of native coronary artery without angina pectoris: Secondary | ICD-10-CM | POA: Insufficient documentation

## 2010-11-11 DIAGNOSIS — Z951 Presence of aortocoronary bypass graft: Secondary | ICD-10-CM | POA: Insufficient documentation

## 2010-11-11 DIAGNOSIS — K219 Gastro-esophageal reflux disease without esophagitis: Secondary | ICD-10-CM | POA: Insufficient documentation

## 2010-11-11 DIAGNOSIS — N189 Chronic kidney disease, unspecified: Secondary | ICD-10-CM | POA: Insufficient documentation

## 2010-11-11 DIAGNOSIS — I252 Old myocardial infarction: Secondary | ICD-10-CM | POA: Insufficient documentation

## 2010-11-11 LAB — DIFFERENTIAL
Basophils Absolute: 0.1 10*3/uL (ref 0.0–0.1)
Basophils Relative: 1 % (ref 0–1)
Neutro Abs: 3 10*3/uL (ref 1.7–7.7)
Neutrophils Relative %: 56 % (ref 43–77)

## 2010-11-11 LAB — POCT I-STAT, CHEM 8
Creatinine, Ser: 5.4 mg/dL — ABNORMAL HIGH (ref 0.4–1.5)
Glucose, Bld: 87 mg/dL (ref 70–99)
Hemoglobin: 12.9 g/dL — ABNORMAL LOW (ref 13.0–17.0)
Sodium: 140 mEq/L (ref 135–145)
TCO2: 37 mmol/L (ref 0–100)

## 2010-11-11 LAB — CBC
Hemoglobin: 11 g/dL — ABNORMAL LOW (ref 13.0–17.0)
MCHC: 33.3 g/dL (ref 30.0–36.0)
Platelets: 196 10*3/uL (ref 150–400)
RBC: 3.64 MIL/uL — ABNORMAL LOW (ref 4.22–5.81)

## 2010-11-11 LAB — PROTIME-INR
INR: 1.01 (ref 0.00–1.49)
Prothrombin Time: 13.5 seconds (ref 11.6–15.2)

## 2010-11-15 LAB — DIFFERENTIAL
Basophils Absolute: 0.1 10*3/uL (ref 0.0–0.1)
Basophils Relative: 1 % (ref 0–1)
Eosinophils Absolute: 0.3 10*3/uL (ref 0.0–0.7)
Eosinophils Relative: 5 % (ref 0–5)
Neutrophils Relative %: 54 % (ref 43–77)

## 2010-11-15 LAB — URINALYSIS, ROUTINE W REFLEX MICROSCOPIC
Ketones, ur: NEGATIVE mg/dL
Protein, ur: >300 mg/dL — AB
Urobilinogen, UA: 0.2 mg/dL (ref 0.0–1.0)

## 2010-11-15 LAB — BASIC METABOLIC PANEL
BUN: 82 mg/dL — ABNORMAL HIGH (ref 6–23)
CO2: 25 mEq/L (ref 19–32)
Chloride: 95 mEq/L — ABNORMAL LOW (ref 96–112)
Creatinine, Ser: 12.29 mg/dL — ABNORMAL HIGH (ref 0.4–1.5)
Glucose, Bld: 100 mg/dL — ABNORMAL HIGH (ref 70–99)

## 2010-11-15 LAB — CBC
HCT: 39.5 % (ref 39.0–52.0)
MCHC: 33.2 g/dL (ref 30.0–36.0)
MCV: 104.5 fL — ABNORMAL HIGH (ref 78.0–100.0)
Platelets: 223 10*3/uL (ref 150–400)
RDW: 16.2 % — ABNORMAL HIGH (ref 11.5–15.5)
WBC: 6.8 10*3/uL (ref 4.0–10.5)

## 2010-11-15 LAB — URINE MICROSCOPIC-ADD ON

## 2010-11-15 LAB — POCT CARDIAC MARKERS: Myoglobin, poc: >500 ng/mL (ref 12–200)

## 2010-11-18 ENCOUNTER — Other Ambulatory Visit (HOSPITAL_COMMUNITY): Payer: Self-pay | Admitting: Nephrology

## 2010-11-18 DIAGNOSIS — N186 End stage renal disease: Secondary | ICD-10-CM

## 2010-11-19 LAB — CBC
HCT: 42.2 % (ref 39.0–52.0)
MCHC: 33.2 g/dL (ref 30.0–36.0)
MCV: 100 fL (ref 78.0–100.0)
Platelets: 165 10*3/uL (ref 150–400)

## 2010-11-19 LAB — DIFFERENTIAL
Basophils Absolute: 0 10*3/uL (ref 0.0–0.1)
Basophils Relative: 1 % (ref 0–1)
Lymphocytes Relative: 22 % (ref 12–46)
Monocytes Absolute: 1 10*3/uL (ref 0.1–1.0)
Monocytes Relative: 17 % — ABNORMAL HIGH (ref 3–12)
Neutro Abs: 3.3 10*3/uL (ref 1.7–7.7)
Neutrophils Relative %: 57 % (ref 43–77)

## 2010-11-19 LAB — COMPREHENSIVE METABOLIC PANEL
Albumin: 3.2 g/dL — ABNORMAL LOW (ref 3.5–5.2)
Alkaline Phosphatase: 111 U/L (ref 39–117)
BUN: 36 mg/dL — ABNORMAL HIGH (ref 6–23)
Creatinine, Ser: 9.21 mg/dL — ABNORMAL HIGH (ref 0.4–1.5)
Glucose, Bld: 86 mg/dL (ref 70–99)
Potassium: 4.7 mEq/L (ref 3.5–5.1)
Total Bilirubin: 0.7 mg/dL (ref 0.3–1.2)
Total Protein: 5.6 g/dL — ABNORMAL LOW (ref 6.0–8.3)

## 2010-11-19 LAB — POCT CARDIAC MARKERS
CKMB, poc: 2.2 ng/mL (ref 1.0–8.0)
Myoglobin, poc: >500 ng/mL (ref 12–200)
Troponin i, poc: <0.05 ng/mL (ref 0.00–0.09)

## 2010-11-19 LAB — CK: Total CK: 664 U/L — ABNORMAL HIGH (ref 7–232)

## 2010-11-21 ENCOUNTER — Ambulatory Visit (HOSPITAL_COMMUNITY)
Admission: RE | Admit: 2010-11-21 | Discharge: 2010-11-21 | Disposition: A | Discharge status: Home / Self Care | Payer: Medicare Other | Source: Ambulatory Visit | Attending: Nephrology | Admitting: Nephrology

## 2010-11-21 ENCOUNTER — Other Ambulatory Visit (HOSPITAL_COMMUNITY): Payer: Self-pay | Admitting: Nephrology

## 2010-11-21 DIAGNOSIS — T82898A Other specified complication of vascular prosthetic devices, implants and grafts, initial encounter: Secondary | ICD-10-CM | POA: Insufficient documentation

## 2010-11-21 DIAGNOSIS — N186 End stage renal disease: Secondary | ICD-10-CM | POA: Insufficient documentation

## 2010-11-21 DIAGNOSIS — I770 Arteriovenous fistula, acquired: Secondary | ICD-10-CM | POA: Insufficient documentation

## 2010-11-21 DIAGNOSIS — Y832 Surgical operation with anastomosis, bypass or graft as the cause of abnormal reaction of the patient, or of later complication, without mention of misadventure at the time of the procedure: Secondary | ICD-10-CM | POA: Insufficient documentation

## 2010-11-21 MED ORDER — IOHEXOL 300 MG/ML  SOLN
150.0000 mL | Freq: Once | INTRAMUSCULAR | Status: AC | PRN
  Administered 2010-11-21: 105 mL via INTRAVENOUS

## 2010-11-23 ENCOUNTER — Inpatient Hospital Stay (INDEPENDENT_AMBULATORY_CARE_PROVIDER_SITE_OTHER)
Admission: RE | Admit: 2010-11-23 | Discharge: 2010-11-23 | Disposition: A | Discharge status: Home / Self Care | Payer: Medicare Other | Source: Ambulatory Visit | Attending: Emergency Medicine | Admitting: Emergency Medicine

## 2010-11-23 DIAGNOSIS — Z760 Encounter for issue of repeat prescription: Secondary | ICD-10-CM

## 2010-12-23 NOTE — Assessment & Plan Note (Signed)
OFFICE VISIT   SEQUOIA, MINCEY  DOB:  [birth year]                                       09/05/2008  ZOXWR#:[id number]   The patient is a 77 year old male who underwent revision of his left  upper arm AV fistula on December 21.  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  Currently he is dialyzing via a Diatek catheter.  He  apparently had some purulent drainage from the lower arm incision at  dialysis the other day and was placed on antibiotics.  A culture was  also taken.   On exam today there is no erythema around the graft portion but there is  a small amount of purulent material that can be discharged from the  lower antecubital incision.  There was an audible bruit in the graft  today and otherwise the upper arm incision is well-healed.   Hopefully this just represents a subcutaneous infection.  This was  probed today and the sinus tract did not seem to communicate all the way  down to the level of the graft.  He will return to see me again in two  weeks' time.  He will continue his antibiotics in the meanwhile.  We  will follow up on the cultures when he returns for followup as well.  If  he has any pointing signs or develops fevers or increased drainage he  will return for followup sooner.   Janetta Hora. Fields, MD  Electronically Signed   CEF/MEDQ  D:  09/05/2008  T:  09/06/2008  Job:  [id number]   cc:   Terrial Rhodes, M.D.

## 2010-12-23 NOTE — Assessment & Plan Note (Signed)
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  The  fistula was originally placed by me in 2005.  He has slowly had  aneurysmal degeneration of the proximal segment of the fistula.  He has  had no prior bleeding episodes from this.  He states that it has just  been slowly getting bigger.  He has no pain from the fistula.  He states  that the fistula has been getting good flow on the dialysis machine.  He  has had no other operations for access except for ligation of one side  branch of this fistula previously.  He currently dialyzes on Tuesday,  Thursday, and Saturday at [facility].   PHYSICAL EXAM:  Today blood pressure 137/71 in the right arm, pulse is  82 and regular.  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  There is an easily palpable thrill in the fistula.  The skin is  slightly thinned and shiny over the proximal aneurysm.   I believe the best option for the patient would be revision of his left  upper arm AV fistula by replacement of the central segment with a  portion of PTFE.  I have discussed with the patient that the patency  after placing a graft would be 75% patency at 1 year as opposed to the  increased patency of the fistula.  However, I told him he is at  increased risk of bleeding if this aneurysm continues to grow.  Since we  are going to revise the fistula in the near future, I believe it is  worthwhile to get a fistulogram as well to make sure he has no other  areas of narrowing that would need to be addressed at the same time.  His fistulogram scheduled for next week.  His revision of his AV fistula  is scheduled for December 21.  He is on no  anticoagulation.   Janetta Hora. Fields, MD  Electronically Signed   CEF/MEDQ  D:  07/18/2008  T:  07/19/2008  Job:  [id number]   cc:   Terrial Rhodes, M.D.

## 2010-12-23 NOTE — Assessment & Plan Note (Signed)
OFFICE VISIT   KOLESON, REIFSTECK  DOB:  [birth year]                                       09/19/2008  ZOXWR#:[id number]   The patient returns for followup today.  He underwent revision of his  left upper arm AV fistula on December 21.  This was done for an  aneurysmal degenerated segment of the fistula.  This was replaced with a  6 mm PTFE graft.  He is currently dialyzing via a left-sided Diatek  catheter.  He was last seen on January 27 and had some purulent drainage  from his incision.  He was placed on antibiotics at that time.  On exam  today he has no further drainage and the incision is well-healed.  There  is no erythema over the graft.  There is an audible bruit in the graft.  There is evidence of steal.  I believe the patient's graft has healed at  this point.  Fortunately there is no obvious evidence of infection.  They will begin to cannulate his graft tomorrow.  He will follow up with  me on an as-needed basis.   Janetta Hora. Fields, MD  Electronically Signed   CEF/MEDQ  D:  09/19/2008  T:  09/20/2008  Job:  [id number]   cc:   Terrial Rhodes, M.D.

## 2010-12-23 NOTE — Op Note (Signed)
**Note Jared-Identified via Obfuscation** NAME:  DEMARIUS, Jared [name] NO.:  [id number]   MEDICAL RECORD NO.:  [id number]          PATIENT TYPE:  EMS   LOCATION:  ED                           FACILITY:  [facility]   PHYSICIAN:  Janetta Hora. Fields, MD  DATE OF BIRTH:  Jan 23, 1934   DATE OF PROCEDURE:  07/30/2008  DATE OF DISCHARGE:  07/29/2008                               OPERATIVE REPORT   PROCEDURE:  1. Revision of left upper arm arteriovenous fistula.  2. Ultrasound of the neck.  3. Insertion of Diatek catheter.   PREOPERATIVE DIAGNOSES:  1. End-stage renal disease.  2. Aneurysm left arm arteriovenous fistula.   POSTOPERATIVE DIAGNOSES:  1. End-stage renal disease.  2. Aneurysm left arm arteriovenous fistula.   ANESTHESIA:  General.   ASSISTANT:  Emilio Aspen, RNFA   OPERATIVE FINDINGS:  1. 6-mm PTFE.  2. 27 cm left internal jugular vein Diatek catheter.   OPERATIVE DETAILS:  After obtaining informed consent, the patient was  taken to the operating room.  The patient was placed in supine position  on the operating room table.  The patient was given sedation, however,  became quite agitated and at this point a general anesthetic was  commenced.  Next, the patient's entire neck and chest were prepped and  draped in usual sterile fashion.  Ultrasound was used to localize the  right and left internal jugular veins.  The right internal jugular vein  was patent but would not compress fully.  The left internal jugular vein  was widely patent with normal compressibility and respiratory variation.  Therefore using ultrasound guidance, the left internal jugular vein was  cannulated and a 0.035 J-tip guidewire threaded through the left  internal jugular vein down in the right atrium and into the inferior  vena cava under fluoroscopic guidance.  Next, sequential 12, 14 and 16-  Jamaica dilators with peel-away sheath were placed over the guidewire  into the right atrium.  The guidewire and dilator were removed and  a 27-  cm Diatek catheter threaded through the peel-away sheath down into the  right atrium.  The peel-away sheath was then removed.  Catheter was  tunneled subcutaneously, cut to length and a hub attached.  The catheter  was noted to flush and draw easily.  The catheter was inspected under  fluoroscopy and found with its tip to be in the right atrium no kinks  throughout its course.  The catheter was sutured to the skin with nylon  sutures.  The neck insertion site was closed with Vicryl stitch.  The  patient tolerated this portion of the procedure well and there were no  complications.   Attention was then turned to the patient's left upper arm.  The patient  had developed aneurysmal degeneration of the central segment of a  preexisting left brachiocephalic AV fistula.  The patient's entire left  upper extremity was prepped and draped in usual sterile fashion.  A  transverse incision was then made approximately 3 cm from the arterial  anastomosis of the fistula.  This was just below the area of aneurysmal  degeneration.  Incision was carried down through the subcutaneous  tissues down the level of the fistula.  The fistula in this area was  approximately 10 mm in diameter.  Vessel loop was placed around this.  Attention was then turned to the portion of the fistula above the  aneurysmal portion.  Another transverse incision was made in this  location and carried down through the subcutaneous tissues down to the  level of the fistula.  Fistula was approximately 6 mm in diameter in  this location.  This was dissected free circumferentially and a vessel  loop placed around this.  Next, a tunneler was used to create a  subcutaneous tunnel lateral and around the outer aspect of the fistula  and a 6-mm PTFE graft brought through the subcutaneous tunnel.  The  patient was then given 5000 units of intravenous heparin.  Proximal  fistula was clamped with a fistula clamp.  Approximately, 3 cm  distal to  this, an additional fistula clamp was placed.  The fistula was then  divided at this level.  Fistula was then sewn end-to-end to the 6-mm  graft using a running 6-0 Prolene suture.  Just prior to completion of  anastomosis, it was fore-bled, back-bled and thoroughly flushed.  Anastomosis was secured.  Clamp was then moved up to the upper arm  incision.  Hemostasis was obtained with thrombin and Gelfoam.  The  proximal portion of the fistula then divided at the proximal aneurysm  was then oversewn with a running 5-0 Prolene suture.  Attention was then  turned to the upper arm incision.  The fistula was ligated proximally  with a 2-0 silk tie.  It was then transected.  The vein was spatulated  slightly.  The graft was beveled and sewn end of graft to end of vein  using a running 6-0 Prolene suture.  Just prior to completion of  anastomosis, it was fore-bled, back-bled and thoroughly flushed.  Anastomosis was secured, clamps released and there was a palpable thrill  above the fistula immediately.  Hemostasis was also obtained here with  thrombin and Gelfoam.   After hemostasis had been obtained in both incisions, the patient was  also given 50 mg of protamine.  Subcutaneous tissues of both incisions  were reapproximated using running 3-0 Vicryl suture.  Skin of both  incisions was closed with a 4-0 Vicryl subcuticular stitch.  The patient  tolerated the procedure well and there were no complications.  Instrument, sponge and needle counts were correct at the end of the  case.  The patient was taken to the recovery room in stable condition.  The patient had palpable radial pulse at the end of the case.      Janetta Hora. Fields, MD  Electronically Signed     CEF/MEDQ  D:  07/30/2008  T:  07/31/2008  Job:  [id number]

## 2010-12-23 NOTE — Cardiovascular Report (Signed)
NAME:  [name], Jared NO.:  [id number]   MEDICAL RECORD NO.:  [id number]          PATIENT TYPE:  OIB   LOCATION:  1962                         FACILITY:  [facility]   PHYSICIAN:  Mohan N. Sharyn Lull, M.D. DATE OF BIRTH:  01/14/1934   DATE OF PROCEDURE:  06/06/2008  DATE OF DISCHARGE:                            CARDIAC CATHETERIZATION   PROCEDURE:  Left cardiac cath with selective left and right coronary  angiography, visualization of saphenous vein graft, left internal  mammary artery graft, measurement of left ventricular end diastole  pressure via right groin using Judkins technique.   INDICATIONS FOR PROCEDURE:  Mr. Jared [name] is 77 year old black male with  past medical history significant for coronary artery disease, history of  MI, status post CABG x5 in 1994.  He had sequential LIMA graft to LAD  and diagonal one, saphenous vein graft to left circumflex, sequential  saphenous vein graft to PDA and distal RCA; hypertension;  hypercholesteremia; end-stage renal disease, on hemodialysis; remote  history of tobacco abuse; history of gouty arthritis; complaints of  retrosternal chest pain off and on without associated symptoms of  nausea, vomiting, or diaphoresis, but also complains of occasional hand  numbness.  Denies any shortness of breath.  Denies palpitation,  lightheadedness, or syncope.  EKG done in the office showed normal sinus  rhythm, LVH with T-wave inversion in anterolateral leads which were more  prominent as compared to prior EKG.  Due to recurrent chest pain,  multiple risk factors, and EKG changes, discussed with the patient  regarding left cath, its risks and benefits i.e. death, MI, stroke, need  for emergency CABG, risk of restenosis, local vascular complications,  etc. and consented for the procedure.   PROCEDURE:  After obtaining the informed consent, the patient was  brought to the cath lab and was placed on fluoroscopy table.  Right  groin was  prepped and draped in usual fashion.  A 2% Xylocaine was used  for local anesthesia in the right groin.  With the help of thin-wall  needle, 4-French arterial sheath was placed.  The sheath was aspirated  and flushed.  Next, a 4-French left Judkins catheter was advanced over  the wire under fluoroscopic guidance into the ascending aorta.  Wire was  pulled out, the catheter was aspirated and connected to the manifold.  Catheter was further advanced and engaged into left coronary ostium.  Multiple views of the left system were taken.  Next, the catheter was  disengaged and was pulled out over the wire and was replaced with 4-  J[place] right 3-D diagnostic catheter which was advanced over the wire  under fluoroscopic guidance up to the ascending aorta.  Wire was pulled  out, the catheter was aspirated and flushed and connected to the  manifold.  Catheter was further advanced and engaged into right coronary  artery.  A single view of right coronary artery was obtained.  Next,  this catheter was disengaged and was pulled out over wire and was  replaced with 4-French left bypass diagnostic catheter which was  advanced over the wire under fluoroscopic guidance up  to the ascending  aorta.  Wire was pulled out, the catheter was aspirated and connected to  manifold.  Catheter was further advanced and engaged into saphenous vein  graft to left circumflex and multiple views of this graft were taken.  Next, the catheter was disengaged and was pulled out over the wire and  was replaced with 4-French right bypass diagnostic catheter which was  advanced over the wire under fluoroscopic guidance up to the ascending  aorta.  Wire was pulled out, the catheter was aspirated and connected to  the manifold.  Catheter was further advanced and engaged into saphenous  vein graft to RCA.  Multiple views of this graft were taken.  Next, the  catheter was disengaged and was pulled out over the wire and was  replaced  with 4-French IM diagnostic catheter which was advanced over  the wire under fluoroscopic guidance up to the ascending aorta.  Catheter was further advanced over the wire into left subclavian artery.  The wire was pulled out, the catheter was aspirated and connected to the  manifold.  Catheter was further engaged into LIMA to LAD.  Multiple  views of this graft were taken.  Next, the catheter was disengaged and  was pulled out over the wire and was replaced with 4-French pigtail  catheter which was advanced over the wire under fluoroscopic guidance up  to the ascending aorta.  Catheter was further advanced across the aortic  valve into the LV.  LV pressures were recorded.  LV graft was not done.  LVEDP was 12 mmHg.  Next, this catheter was pulled out into the aorta.  There was no gradient across aortic valve.  Next, the pigtail catheter  was pulled out over the wire, sheaths aspirated and flushed.   FINDINGS:  LV:  LV was not done.  LVEDP was 12 mmHg.  Left main was  short which was patent.  LAD has 95% ostial stenosis and 100% occluded  beyond the proximal portion.  Left circumflex has 70% mid stenosis with  bifurcation with OM-3.  OM-1 and OM-2 were very very small which were  diffusely diseased.  OM-3 has 20%-30% ostial stenosis.  RCA was 100%  occluded beyond proximal portion.  Saphenous vein graft to RCA and PDA  is patent.  Saphenous vein graft to the left circumflex has 20%-30%  proximal stenosis with haziness with TIMI grade 3 distal flow.  Sequential LIMA graft to LAD and diagonal one is patent, although native  LAD beyond anastomosis has 60%-70% stenosis.  Vessel distally is very  small.  Of note, the patient also has large intercostal artery which was  arising from proximal LIMA which was not ligated as before.  The patient  tolerated procedure well.  There were no complications.  The patient was  transferred to recovery room in stable condition.      Jared [name]. Sharyn Lull,  M.D.  Electronically Signed     MNH/MEDQ  D:  06/06/2008  T:  06/06/2008  Job:  [id number]   cc:   Cath Lab

## 2010-12-23 NOTE — Cardiovascular Report (Signed)
NAME:  Jared [name], FEICK NO.:  [id number]   MEDICAL RECORD NO.:  [id number]          PATIENT TYPE:  OIB   LOCATION:  NA                           FACILITY:  [facility]   PHYSICIAN:  Mohan N. Sharyn Lull, M.D. DATE OF BIRTH:  1933/10/20   DATE OF PROCEDURE:  04/07/2007  DATE OF DISCHARGE:                            CARDIAC CATHETERIZATION   PROCEDURE:  Left cardiac cath with selective left and right coronary  angiography, visualization of saphenous vein graft, LIMA graft, LV graft  via right groin using Judkins technique.   INDICATIONS FOR PROCEDURE:  Mr. Ladona Ridgel is 77 year old black male with  past medical history significant for coronary artery disease, history of  MI, status post CABG times five in 1994.  He had sequential LIMA graft  to LAD and diagonal-1, saphenous vein graft to left circumflex and  sequential saphenous vein graft to distal RCA and PDA, hypertension,  hypercholesteremia and stage renal disease on hemodialysis, history of  gouty arthritis, remote history of tobacco abuse, complains of  retrosternal chest pain pressure off and on with minimal exertion  associated with shortness of breath, relieved with rest.  Denies any  nausea, vomiting, diaphoresis.  Denies PND, orthopnea, leg swelling.  Denies any recent cardiac workup.  EKG done in the office showed normal  sinus rhythm with ST-T wave changes in anterolateral leads suggestive of  ischemia.  Due to typical anginal chest pain and multiple risk factors  and EKG changes, discussed with the patient regarding left cath,  possible PTCA stenting, its risks and benefits i.e. death, stroke, need  for emergency CABG, risk of restenosis, local vascular complications,  etc, and consented for the procedure.   PROCEDURE:  After obtaining informed consent, the patient was brought to  the cath lab and was placed on fluoroscopy table.  Right groin was  prepped and draped in usual fashion and 2% Xylocaine was used for  local  anesthesia in the right groin.  With the help of thin-wall needle, 6-  French arterial sheath was placed.  The sheath was aspirated and  flushed.  Next, a 6-French left Judkins catheter was advanced over the  wire under fluoroscopic guidance up to the ascending aorta.  Wire was  pulled out, the catheter was aspirated and connected to the manifold.  Catheter was further advanced and engaged into left coronary ostium.  Multiple views of the left system were taken.  Next, the catheter was  disengaged and was pulled out over the wire and was replaced with 6-  [place] right Judkins catheter which was advanced over the wire under  fluoroscopic guidance up to the ascending aorta.  Wire was pulled out.  The catheter was aspirated and connected to the manifold.  Catheter was  further advanced and engaged into right coronary ostium.  Multiple views  of the right coronary artery were obtained.  Next, this catheter was  disengaged and was engaged into sequential saphenous vein graft to RCA  and PDA.  Multiple views of this graft were taken.  Next, this catheter  was disengaged and was then advanced under fluoroscopic guidance  over  the wire up to the left subclavian artery.  This catheter was further  advanced and engaged into LIMA to sequential graft to LAD and diagonal-  1.  Multiple views of this graft were taken.  Next, this catheter was  disengaged and was pulled out over the wire and was replaced with 6-  J[place] left bypass diagnostic catheter which was advanced over the wire  under fluoroscopic guidance up to the ascending aorta.  Wire was pulled  out.  The catheter was aspirated and connected to the manifold.  Catheter was further advanced and engaged into saphenous vein graft to  obtuse marginal-1.  Multiple views of this graft were taken.  Next, the  catheter was disengaged and was pulled out over the wire and was  replaced with 6-French pigtail catheter which was advanced over the  wire  under fluoroscopic guidance up to the ascending aorta.  Wire was pulled  out.  The catheter was aspirated and connected to the manifold.  Catheter was further advanced across the aortic valve into the LV.  LV  pressures were recorded.  Next, LV graft was done in 30 degrees RAO  position.  Post angiographic pressures were recorded from LV and then  pullback pressures were recorded from the aorta.  There was no gradient  across the aortic valve.  Next, the pigtail catheter was pulled out over  the wire.  Sheaths were aspirated and flushed.   FINDINGS:  LV showed good LV systolic function, EF of 50-55%.  Left main  was short which was patent.  LAD has 95% ostial stenosis and then was  100% occluded beyond the proximal portion.  Left circumflex has 50-60%  mid stenosis at bifurcation with OM-3.  OM-1 was 100% occluded.  OM-2  and OM-3 were very small which were patent.  OM-4 was moderate size  which was patent.  RCA has complex sequential 90-95% stenosis in  proximal portion and then was 100% occluded.  Sequential saphenous vein  graft and PDA is patent.  Distal native RCA has 80-85% stenosis which is  small vessel and not suitable for PCI.  Saphenous vein graft to obtuse  marginal-1 has 20% proximal stenosis.  Sequential LIMA graft to LAD and  diagonal-1 is patent.  Native LAD distally is diffusely diseased and has  focal 70% stenosis.  Distally, the vessel is very small in supplying  small area of myocardium.  Of note, there was one large first  intercostal artery which was not ligated which was patent.  The patient  tolerated procedure well.  There were no complications.  The patient was  transferred to recovery room in stable condition.  The patient will be  dialyzed later this afternoon and will be discharged home after  hemodialysis.      [name]. Sharyn Lull, M.D.  Electronically Signed     MNH/MEDQ  D:  04/07/2007  T:  04/08/2007  Job:  [id number]   cc:   Garnetta Buddy,  M.D.  Catheterization Laboratory
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
## 2010-12-26 NOTE — Op Note (Signed)
NAME:  Jared [name], Jared [name]                          ACCOUNT NO.:  [id number]   MEDICAL RECORD NO.:  [id number]                   PATIENT TYPE:  OIB   LOCATION:  2852                                 FACILITY:  [facility]   PHYSICIAN:  Janetta Hora. Fields, MD               DATE OF BIRTH:  20-Dec-1933   DATE OF PROCEDURE:  07/09/2003  DATE OF DISCHARGE:                                 OPERATIVE REPORT   PROCEDURE:  Creation of left arm radial to cephalic fistula.   PREOPERATIVE DIAGNOSIS:  End-stage renal disease.   POSTOPERATIVE DIAGNOSIS:  End-stage renal disease.   ANESTHESIA:  Local with IV sedation.   SURGEON:  Janetta Hora. Fields, M.D.   ASSISTANT:  Nurse.   INDICATIONS FOR PROCEDURE:  The patient is a 77 year old male who is nearing  the need for long-term hemodialysis.  He now presents for placement of long-  term hemodialysis access.   FINDINGS:  1. A 3.5 mm sub-cephalic.  2. A 2.0 mm radial artery.   DESCRIPTION OF PROCEDURE:  After obtaining an informed consent, the patient  is taken to the operating room.  The patient is placed in the supine  position on the operating room table.  Next, the patient's entire left upper  extremity is prepped and draped in the usual sterile fashion.  Local  anesthesia was infiltrated in the left arm near the wrist between the  location of the radial artery and the cephalic vein.  A skin incision was  then made in this location, and a subcutaneous flap was elevated to dissect  the cephalic vein free circumferentially for  approximately 4.0 cm.  The  cephalic vein was of good quality.  The side branches were ligated and  divided between silk ties.  Next, the radial artery was dissected free  circumferentially.  This was found to be small in caliber and had some  spasm.  This was also dissected free for approximately 4.0 cm.  Next, the  patient was given 5000 units of intravenous heparin.  The distal cephalic  vein was ligated with a silk tie and  divided.  The vein was then flushed  thoroughly with heparinized saline.  The vein was then swung over to the  radial artery and the proximal distal control of the radial artery was  maintained with Vesi-loops.  A longitudinal arteriotomy was made, and the  end of the vein was sewn to the side of the artery using a running #7-0  Prolene suture.  Prior to the completion of the anastomosis, the usual  flushing procedures were performed.  The proximal and distal control was  released, and the fistula was noted to fill immediately.  There was a weak  thrill within the fistula.  The fistula had turbulent flow up to the mid-  forearm by Doppler examination.  Next, hemostasis was obtained.  Papaverine  solution was then placed on the cephalic  vein and radial artery to reduce  spasm.  Next, the subcutaneous tissues were reapproximated using a running  #3-0 Vicryl stitch.  The skin was then closed with a #4-0 Vicryl  subcuticular stitch.  Benzoin and Steri-strips were applied, and  a dry sterile dressing was placed over this.  The patient tolerated the procedure well and there were no complications.  The instrument, sponge and needle counts were correct at the end of the  case.  The patient was taken to the recovery room in stable condition.                                               Janetta Hora. Fields, MD    CEF/MEDQ  D:  07/09/2003  T:  07/09/2003  Job:  [id number]
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
## 2010-12-26 NOTE — Discharge Summary (Signed)
NAME:  Jared [name], Jared [name]                          ACCOUNT NO.:  [id number]   MEDICAL Jared [name] NO.:  [id number]                   PATIENT TYPE:  INP   LOCATION:  3712                                 FACILITY:  [facility]   PHYSICIAN:  Mohan N. Sharyn Lull, M.D.              DATE OF BIRTH:  [birth year]   DATE OF ADMISSION:  12/08/2003  DATE OF DISCHARGE:  12/11/2003                                 DISCHARGE SUMMARY   ADMISSION DIAGNOSES:  1. Chest pain, rule out myocardial infarction.  2. Hypertension.  3. Renal insufficiency.  4. Cocaine abuse.   FINAL DIAGNOSES:  1. Probable status post small non-Q-wave myocardial infarction secondary to     vasospasm.  2. Cocaine abuse.  3. Coronary artery disease.  4. History of coronary artery bypass grafting in the past.  5. Chronic renal insufficiency.  6. Hypertension.  7. Hypercholesterolemia.  8. Hypothyroidism.  9. History of gouty arthritis.   DISCHARGE MEDICATIONS:  1. Baby aspirin 81 mg one tablet daily.  2. Plavix 75 mg one tablet daily with food.  3. Norvasc 5 mg one tablet daily.  4. Toprol-XL 25 mg one tablet daily.  5. Lipitor 20 mg one tablet daily.  6. Levothyroxine 100 mcg one tablet daily as before.  7. Nitrostat sublingual 0.4 mg use as directed.  8. Trazodone 100 mg one tablet daily at night as before.   DISCHARGE INSTRUCTIONS:  Avoid heavy lifting, pushing, or pulling. Low salt,  low cholesterol, renal diet. The patient has been advised to avoid cocaine  or tobacco abuse. BMP and uric acid level in one week. Follow up with me in  one week.   CONDITION ON DISCHARGE:  Stable.   BRIEF HISTORY AND HOSPITAL COURSE:  Jared [name] is a 77 year old black male  with past medical history significant for coronary artery disease status  post coronary artery bypass grafting in 1999, hypertension,  hypercholesterolemia, hypothyroidism, cocaine abuse, history of gouty  arthritis. Was admitted on April 29 by Dr. Algie Coffer because of chest  pain  which lasted for a few hours. The patient was localized. The patient  received four baby aspirin in the ER and was noted to have elevated CPK-MBs.  The patient states he has been smoking cocaine lately. The patient denied  any palpitations, lightheadedness, or syncope. Denies history of PND,  orthopnea, or leg swelling.   PAST MEDICAL HISTORY:  As above.   PAST SURGICAL HISTORY:  Coronary artery bypass grafting x5 in 1999.   MEDICATIONS:  At home, he was on:  1. Norvasc.  2. Allopurinol.  3. Trazodone.  4. Aspirin.  5. Lasix.  6. Levothyroxine.  7. Zantac.   ALLERGIES:  None.   SOCIAL HISTORY:  Separated for 30 years. He has two sons and two daughters.  Lives alone. Worked for [employer].   FAMILY HISTORY:  Mother died of stroke at the age of 23. Father died  of MI  at the age of 46.   PHYSICAL EXAMINATION:  GENERAL:  On examination, he was alert, awake,  oriented x3.  VITAL SIGNS:  Blood pressure 128/69, pulse 64 and regular.  HEART:  Conjunctivae was pink.  NECK:  Supple. No JVD.  LUNGS:  Clear to auscultation without rhonchi or rales.  CARDIOVASCULAR:  S1 and S2 was normal.  ABDOMEN:  Soft.  EXTREMITIES:  There is no edema.  CENTRAL NERVOUS SYSTEM:  Grossly intact.   STUDIES:  EKG showed normal sinus rhythm with nonspecific ST-T wave changes.  CK first set was 1,488, MB was 35.7, relative index 2.4. The troponin I was  negative, 0.02. His second set CK, CK-MB, and relative index were within  normal range. His sodium was 137, potassium 3.6, chloride 105, bicarb 23,  glucose 93, BUN 65, creatinine 4.2. TSH 0.24 which was slightly low.  Hemoglobin was 13.5, hematocrit 40.2, white count 7.9.   BRIEF HOSPITAL COURSE:  The patient was admitted to telemetry unit. The  patient did not have any further episodes of chest pain during the hospital  stay, and three sets of troponin I were negative. Due to renal insufficiency  and mildly elevated CPKs and no  acute significant EKG changes discussed with  patient regarding noninvasive stress testing to risk stratify versus left  catheterization ____________ the procedure, its risks and benefits, the  patient wanted to proceed with noninvasive stress testing first. The patient  underwent Persantine Cardiolite yesterday which showed no evidence of  reversible ischemia with EF of 53%. The patient did not have any further  episodes of chest pain during the hospital stay. The patient will be  discharged home on above medications and will be followed up in my office in  one week.                                               [name]. Sharyn Lull, M.D.   MNH/MEDQ  D:  12/11/2003  T:  12/12/2003  Job:  [id number]
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
## 2010-12-26 NOTE — Op Note (Signed)
NAME:  Jared [name], Jared [name] NO.:  [id number]   MEDICAL RECORD NO.:  [id number]          PATIENT TYPE:  AMB   LOCATION:  SDS                          FACILITY:  [facility]   PHYSICIAN:  Di Kindle. Edilia Bo, M.D.DATE OF BIRTH:  1933-10-08   DATE OF PROCEDURE:  01/29/2006  DATE OF DISCHARGE:                                 OPERATIVE REPORT   PREOPERATIVE DIAGNOSIS:  Poorly functioning left upper arm arteriovenous  fistula.   POSTOPERATIVE DIAGNOSIS:  Poorly functioning left upper arm arteriovenous  fistula.   PROCEDURE:  Ligation of competing branch of left arteriovenous fistula.   SURGEON:  Di Kindle. Edilia Bo, M.D.   ASSISTANT:  Constance Holster, P.A.-C.   ANESTHESIA:  Local with sedation.   TECHNIQUE:  The patient was taken to the operating room and sedated by  anesthesia.  The left upper extremity was prepped and draped in the usual  sterile fashion.  The branch could easily be identified on exam and it was  marked.  The skin was anesthetized with 1% lidocaine.  An oblique incision  was made over the branch and the branch was identified and ligated between 2-  0 silk ties and divided.  Hemostasis was obtained.  The wound was closed  with a deep layer of 3-0 Vicryl and the skin closed with 4-0 Vicryl.  A  sterile dressing was applied.  The patient tolerated the procedure well and  was transferred to the recovery room in satisfactory condition.  All needle  and sponge counts were correct.      Di Kindle. Edilia Bo, M.D.  Electronically Signed     CSD/MEDQ  D:  01/29/2006  T:  01/29/2006  Job:  [id number]
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
## 2010-12-26 NOTE — Op Note (Signed)
NAME:  Jared [name], Jared [name] NO.:  [id number]   MEDICAL RECORD NO.:  [id number]                   PATIENT TYPE:  OIB   LOCATION:  2887                                 FACILITY:  [facility]   PHYSICIAN:  Janetta Hora. Fields, MD               DATE OF BIRTH:  09-Apr-1934   DATE OF PROCEDURE:  01/02/2004  DATE OF DISCHARGE:  01/02/2004                                 OPERATIVE REPORT   PROCEDURE:  Left brachiocephalic arteriovenous fistula.   PREOPERATIVE DIAGNOSIS:  End stage renal disease.   POSTOPERATIVE DIAGNOSIS:  End stage renal disease.   ANESTHESIA:  Local with IV sedation.   ASSISTANT:  Coral Ceo, P.A.   OPERATIVE FINDINGS:  1. Cephalic vein 4-5 mm in diameter.  2. Brachial artery 4-5 mm in diameter.   PROCEDURE IN DETAIL:  After obtaining informed consent, the patient was  taken to the operating room.  The patient was placed in the supine position  on the operating table.  Next, the patient's entire left upper extremity was  prepped and draped in the usual sterile fashion.  Local anesthesia was  infiltrated near the antecubital crease.  A transverse incision was made in  this location and the cephalic vein was dissected free circumferentially for  approximately 5 cm.  The vein was of good quality and approximately 4-5 mm  in diameter.  Next, the brachial artery was dissected free circumferentially  in the medial aspect of the incision for approximately 3 cm.  This was  controlled proximally and distally with vessel loops.  The patient was then  given 5000 units of intravenous  heparin.  A longitudinal arteriotomy was  made and the vein was transected distally and ligated with silk ties and  swung over to the level of the artery.  An end of vein to side of artery  anastomosis was then created using a running 7-0 Prolene suture.  Prior to  completion of the anastomosis, the usual flushing procedures were performed.  The clamps were released.  Hemostasis  was obtained.  There was a palpable  thrill in the fistula immediately.  Next, the subcutaneous tissues were  reapproximated using a running 3-0 Vicryl suture.  The skin was closed with  a 4-0 Vicryl subcuticular stitch.  The patient tolerated the procedure well  and there were no complications.  The patient was taken to the recovery room  in stable condition.  The instrument, sponge, and needle counts were correct  at the end of the case.                                               Janetta Hora. Fields, MD    CEF/MEDQ  D:  01/02/2004  T:  01/02/2004  Job:  [id number]
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
## 2010-12-30 ENCOUNTER — Emergency Department (HOSPITAL_COMMUNITY)
Admission: EM | Admit: 2010-12-30 | Discharge: 2010-12-30 | Disposition: A | Discharge status: Home / Self Care | Payer: Medicare Other | Attending: Emergency Medicine | Admitting: Emergency Medicine

## 2010-12-30 DIAGNOSIS — Z992 Dependence on renal dialysis: Secondary | ICD-10-CM | POA: Insufficient documentation

## 2010-12-30 DIAGNOSIS — I252 Old myocardial infarction: Secondary | ICD-10-CM | POA: Insufficient documentation

## 2010-12-30 DIAGNOSIS — Y841 Kidney dialysis as the cause of abnormal reaction of the patient, or of later complication, without mention of misadventure at the time of the procedure: Secondary | ICD-10-CM | POA: Insufficient documentation

## 2010-12-30 DIAGNOSIS — E039 Hypothyroidism, unspecified: Secondary | ICD-10-CM | POA: Insufficient documentation

## 2010-12-30 DIAGNOSIS — T82898A Other specified complication of vascular prosthetic devices, implants and grafts, initial encounter: Secondary | ICD-10-CM | POA: Insufficient documentation

## 2010-12-30 DIAGNOSIS — I120 Hypertensive chronic kidney disease with stage 5 chronic kidney disease or end stage renal disease: Secondary | ICD-10-CM | POA: Insufficient documentation

## 2010-12-30 DIAGNOSIS — I2510 Atherosclerotic heart disease of native coronary artery without angina pectoris: Secondary | ICD-10-CM | POA: Insufficient documentation

## 2010-12-30 DIAGNOSIS — N186 End stage renal disease: Secondary | ICD-10-CM | POA: Insufficient documentation

## 2010-12-30 DIAGNOSIS — K219 Gastro-esophageal reflux disease without esophagitis: Secondary | ICD-10-CM | POA: Insufficient documentation

## 2011-01-26 ENCOUNTER — Emergency Department (HOSPITAL_COMMUNITY)
Admission: EM | Admit: 2011-01-26 | Discharge: 2011-01-27 | Disposition: A | Discharge status: Home / Self Care | Payer: Medicare Other | Attending: Emergency Medicine | Admitting: Emergency Medicine

## 2011-01-26 DIAGNOSIS — I252 Old myocardial infarction: Secondary | ICD-10-CM | POA: Insufficient documentation

## 2011-01-26 DIAGNOSIS — K219 Gastro-esophageal reflux disease without esophagitis: Secondary | ICD-10-CM | POA: Insufficient documentation

## 2011-01-26 DIAGNOSIS — Z992 Dependence on renal dialysis: Secondary | ICD-10-CM | POA: Insufficient documentation

## 2011-01-26 DIAGNOSIS — M79609 Pain in unspecified limb: Secondary | ICD-10-CM | POA: Insufficient documentation

## 2011-01-26 DIAGNOSIS — Z951 Presence of aortocoronary bypass graft: Secondary | ICD-10-CM | POA: Insufficient documentation

## 2011-01-26 DIAGNOSIS — H409 Unspecified glaucoma: Secondary | ICD-10-CM | POA: Insufficient documentation

## 2011-01-26 DIAGNOSIS — E039 Hypothyroidism, unspecified: Secondary | ICD-10-CM | POA: Insufficient documentation

## 2011-01-26 DIAGNOSIS — N186 End stage renal disease: Secondary | ICD-10-CM | POA: Insufficient documentation

## 2011-01-26 DIAGNOSIS — I120 Hypertensive chronic kidney disease with stage 5 chronic kidney disease or end stage renal disease: Secondary | ICD-10-CM | POA: Insufficient documentation

## 2011-01-26 DIAGNOSIS — I2510 Atherosclerotic heart disease of native coronary artery without angina pectoris: Secondary | ICD-10-CM | POA: Insufficient documentation

## 2011-02-13 ENCOUNTER — Other Ambulatory Visit (HOSPITAL_COMMUNITY): Payer: Self-pay | Admitting: Nephrology

## 2011-02-13 DIAGNOSIS — N186 End stage renal disease: Secondary | ICD-10-CM

## 2011-02-18 ENCOUNTER — Ambulatory Visit (HOSPITAL_COMMUNITY)
Admission: RE | Admit: 2011-02-18 | Discharge: 2011-02-18 | Disposition: A | Discharge status: Home / Self Care | Payer: Medicare Other | Source: Ambulatory Visit | Attending: Nephrology | Admitting: Nephrology

## 2011-02-18 ENCOUNTER — Other Ambulatory Visit (HOSPITAL_COMMUNITY): Payer: Self-pay | Admitting: Nephrology

## 2011-02-18 DIAGNOSIS — Y832 Surgical operation with anastomosis, bypass or graft as the cause of abnormal reaction of the patient, or of later complication, without mention of misadventure at the time of the procedure: Secondary | ICD-10-CM | POA: Insufficient documentation

## 2011-02-18 DIAGNOSIS — M109 Gout, unspecified: Secondary | ICD-10-CM | POA: Insufficient documentation

## 2011-02-18 DIAGNOSIS — N186 End stage renal disease: Secondary | ICD-10-CM | POA: Insufficient documentation

## 2011-02-18 DIAGNOSIS — N2581 Secondary hyperparathyroidism of renal origin: Secondary | ICD-10-CM | POA: Insufficient documentation

## 2011-02-18 DIAGNOSIS — H409 Unspecified glaucoma: Secondary | ICD-10-CM | POA: Insufficient documentation

## 2011-02-18 DIAGNOSIS — T82898A Other specified complication of vascular prosthetic devices, implants and grafts, initial encounter: Secondary | ICD-10-CM | POA: Insufficient documentation

## 2011-02-18 DIAGNOSIS — I2510 Atherosclerotic heart disease of native coronary artery without angina pectoris: Secondary | ICD-10-CM | POA: Insufficient documentation

## 2011-02-18 DIAGNOSIS — Z992 Dependence on renal dialysis: Secondary | ICD-10-CM | POA: Insufficient documentation

## 2011-02-18 MED ORDER — IOHEXOL 300 MG/ML  SOLN
100.0000 mL | Freq: Once | INTRAMUSCULAR | Status: AC | PRN
  Administered 2011-02-18: 40 mL via INTRAVENOUS

## 2011-03-30 ENCOUNTER — Emergency Department (HOSPITAL_COMMUNITY): Payer: Medicare Other

## 2011-03-30 ENCOUNTER — Emergency Department (HOSPITAL_COMMUNITY)
Admission: EM | Admit: 2011-03-30 | Discharge: 2011-03-30 | Disposition: A | Discharge status: Home / Self Care | Payer: Medicare Other | Attending: Emergency Medicine | Admitting: Emergency Medicine

## 2011-03-30 DIAGNOSIS — R1013 Epigastric pain: Secondary | ICD-10-CM | POA: Insufficient documentation

## 2011-03-30 DIAGNOSIS — Z951 Presence of aortocoronary bypass graft: Secondary | ICD-10-CM | POA: Insufficient documentation

## 2011-03-30 DIAGNOSIS — I252 Old myocardial infarction: Secondary | ICD-10-CM | POA: Insufficient documentation

## 2011-03-30 DIAGNOSIS — M109 Gout, unspecified: Secondary | ICD-10-CM | POA: Insufficient documentation

## 2011-03-30 DIAGNOSIS — K219 Gastro-esophageal reflux disease without esophagitis: Secondary | ICD-10-CM | POA: Insufficient documentation

## 2011-03-30 DIAGNOSIS — N19 Unspecified kidney failure: Secondary | ICD-10-CM | POA: Insufficient documentation

## 2011-03-30 DIAGNOSIS — I2510 Atherosclerotic heart disease of native coronary artery without angina pectoris: Secondary | ICD-10-CM | POA: Insufficient documentation

## 2011-03-30 DIAGNOSIS — Z992 Dependence on renal dialysis: Secondary | ICD-10-CM | POA: Insufficient documentation

## 2011-03-30 DIAGNOSIS — I10 Essential (primary) hypertension: Secondary | ICD-10-CM | POA: Insufficient documentation

## 2011-03-30 DIAGNOSIS — H409 Unspecified glaucoma: Secondary | ICD-10-CM | POA: Insufficient documentation

## 2011-03-30 DIAGNOSIS — I440 Atrioventricular block, first degree: Secondary | ICD-10-CM | POA: Insufficient documentation

## 2011-03-30 LAB — DIFFERENTIAL
Basophils Absolute: 0.1 10*3/uL (ref 0.0–0.1)
Basophils Relative: 2 % — ABNORMAL HIGH (ref 0–1)
Eosinophils Relative: 6 % — ABNORMAL HIGH (ref 0–5)
Monocytes Absolute: 0.5 10*3/uL (ref 0.1–1.0)

## 2011-03-30 LAB — POCT I-STAT TROPONIN I: Troponin i, poc: 0.03 ng/mL (ref 0.00–0.08)

## 2011-03-30 LAB — POCT I-STAT, CHEM 8
Creatinine, Ser: 11.6 mg/dL — ABNORMAL HIGH (ref 0.50–1.35)
HCT: 39 % (ref 39.0–52.0)
Hemoglobin: 13.3 g/dL (ref 13.0–17.0)
Sodium: 138 mEq/L (ref 135–145)
TCO2: 28 mmol/L (ref 0–100)

## 2011-03-30 LAB — COMPREHENSIVE METABOLIC PANEL
Albumin: 3.6 g/dL (ref 3.5–5.2)
Alkaline Phosphatase: 151 U/L — ABNORMAL HIGH (ref 39–117)
BUN: 55 mg/dL — ABNORMAL HIGH (ref 6–23)
Calcium: 9 mg/dL (ref 8.4–10.5)
Creatinine, Ser: 11.29 mg/dL — ABNORMAL HIGH (ref 0.50–1.35)
GFR calc Af Amer: 5 mL/min — ABNORMAL LOW (ref >60)
Glucose, Bld: 84 mg/dL (ref 70–99)
Total Protein: 7.8 g/dL (ref 6.0–8.3)

## 2011-03-30 LAB — CBC
MCHC: 32.2 g/dL (ref 30.0–36.0)
Platelets: 291 10*3/uL (ref 150–400)
RDW: 14 % (ref 11.5–15.5)
WBC: 5.4 10*3/uL (ref 4.0–10.5)

## 2011-03-30 LAB — LIPASE, BLOOD: Lipase: 54 U/L (ref 11–59)

## 2011-04-06 ENCOUNTER — Ambulatory Visit (INDEPENDENT_AMBULATORY_CARE_PROVIDER_SITE_OTHER): Payer: Medicare Other | Admitting: Vascular Surgery

## 2011-04-06 ENCOUNTER — Encounter (INDEPENDENT_AMBULATORY_CARE_PROVIDER_SITE_OTHER): Payer: Medicare Other

## 2011-04-06 ENCOUNTER — Encounter: Payer: Self-pay | Admitting: Vascular Surgery

## 2011-04-06 VITALS — BP 156/66 | HR 59 | Resp 18 | Ht 65.5 in | Wt 174.0 lb

## 2011-04-06 DIAGNOSIS — T82898A Other specified complication of vascular prosthetic devices, implants and grafts, initial encounter: Secondary | ICD-10-CM

## 2011-04-06 DIAGNOSIS — Z01810 Encounter for preprocedural cardiovascular examination: Secondary | ICD-10-CM

## 2011-04-06 DIAGNOSIS — N186 End stage renal disease: Secondary | ICD-10-CM

## 2011-04-06 DIAGNOSIS — N19 Unspecified kidney failure: Secondary | ICD-10-CM

## 2011-04-06 NOTE — Progress Notes (Signed)
Subjective:     Patient ID: Jared [name], male   DOB: [birth year], 77 y.o.   MRN: [id number]  HPI this 77 year old male patient was referred for vascular access evaluation. He is on hemodialysis on Tuesday Thursday and Saturday. He has a left upper arm AV fistula which we created 2005. Recently it has had multiple interventions including angioplasty procedure performed in July of this year. There is a central vein stenosis on the left which is mild and a severe stenosis at the cephalic vein junction with the subclavian vein. He also has 2 significant aneurysms in the left upper arm AV fistula. He has never had access in his right upper extremity.  Past Medical History  Diagnosis Date  . Psychosexual dysfunction with inhibited sexual excitement   . Other specified personal history presenting hazards to health   . Generalized osteoarthrosis, unspecified site   . Osteoarthrosis, unspecified whether generalized or localized, unspecified site   . 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 Unspecified hypothyroidism   . Gout   . CAD (coronary artery disease)   . Unspecified deficiency anemia   . Unspecified essential hypertension   . End stage renal disease     History  Substance Use Topics  . Smoking status: Former Smoker -- 15 years    Types: Cigarettes    Quit date: 04/06/1991  . Smokeless tobacco: Never Used  . Alcohol Use: No    Family History  Problem Relation Age of Onset  . Heart disease Mother   . Heart disease Father   . Heart disease Sister     Allergies no known allergies  Current outpatient prescriptions:acetaminophen (TYLENOL) 500 MG tablet, Take 500 mg by mouth every 6 (six) hours as needed.  , Disp: , Rfl: ;  amLODipine (NORVASC) 10 MG tablet, Take 10 mg by mouth daily.  , Disp: , Rfl: ;  aspirin 81 MG tablet, Take 81 mg by mouth daily.  , Disp: , Rfl: ;  B Complex-C-Folic Acid (DIALYVITE PO), Take 1 tablet by mouth daily.  , Disp: ,  Rfl:  cinacalcet (SENSIPAR) 30 MG tablet, Take 30 mg by mouth daily.  , Disp: , Rfl: ;  clopidogrel (PLAVIX) 75 MG tablet, Take 75 mg by mouth daily.  , Disp: , Rfl: ;  famotidine (PEPCID) 20 MG tablet, Take 20 mg by mouth daily.  , Disp: , Rfl: ;  Glucosamine-Chondroit-Vit C-Mn (GLUCOSAMINE 1500 COMPLEX) CAPS, Take 1 capsule by mouth daily.  , Disp: , Rfl: ;  isosorbide mononitrate (IMDUR) 60 MG 24 hr tablet, Take 60 mg by mouth daily.  , Disp: , Rfl:  Levothyroxine Sodium 88 MCG CAPS, Take 1 capsule by mouth daily.  , Disp: , Rfl: ;  Multiple Vitamin (MULTIVITAMIN) tablet, Take 1 tablet by mouth daily.  , Disp: , Rfl: ;  niacin 500 MG CR capsule, Take 500 mg by mouth 2 (two) times daily.  , Disp: , Rfl: ;  nitroGLYCERIN (NITROSTAT) 0.4 MG SL tablet, Place 0.4 mg under the tongue every 5 (five) minutes as needed.  , Disp: , Rfl:  nystatin (MYCOSTATIN) cream, Apply 1 application topically as needed.  , Disp: , Rfl: ;  traMADol (ULTRAM) 50 MG tablet, Take 50 mg by mouth every 6 (six) hours as needed.  , Disp: , Rfl: ;  travoprost, benzalkonium, (TRAVATAN Z) 0.004 % ophthalmic solution, 1 drop as directed.  , Disp: , Rfl: ;  ranitidine (ZANTAC) 150 MG tablet, Take  150 mg by mouth daily.  , Disp: , Rfl:   BP 156/66  Pulse 59  Resp 18  Ht 5' 5.5" (1.664 m)  Wt 174 lb (78.926 kg)  BMI 28.51 kg/m2  Body mass index is 28.51 kg/(m^2).        Review of Systems patient denies chest pain dyspnea on exertion PND or orthopnea appear he does have diffuse arthritis with severe back pain and joint pain. ENT lites slowly with a walking stick. He also uses a wheelchair for unknown reasons. All other systems are negative and a complete review of systems other than his end-stage renal disease and dialysis.    Objective:   Physical Exam blood pressure 156/66 heart rate 59 respirations He is a chronically ill-appearing male who is in a wheelchair. He is in no apparent distress he is alert and oriented x3 Chest  clear to auscultation no rhonchi or wheezing Cardiovascular exam regular rhythm no murmurs Abdomen is obese no palpable masses Lower extremity exam reveals 2+ femoral pulses bilaterally  Neurologic exam is normal Skin exam reveals no rashes    Assessment:     Today I ordered vein mapping of the right upper extremity. Reviewed and interpreted this. Also did an independent sono site exam of the right upper extremity veins. Cephalic vein in the right upper arm is patent borderline in size with some thickening of the walls. Basilic vein appears adequate.    Plan:    creation of right upper arm brachial artery to cephalic vein AV fistula on Wednesday, September 5. Risks t benefits have been thoroughly discussed with the patient he would like to proceed.

## 2011-04-15 ENCOUNTER — Ambulatory Visit (HOSPITAL_COMMUNITY)
Admission: RE | Admit: 2011-04-15 | Discharge: 2011-04-15 | Disposition: A | Discharge status: Home / Self Care | Payer: Medicare Other | Source: Ambulatory Visit | Attending: Vascular Surgery | Admitting: Vascular Surgery

## 2011-04-15 ENCOUNTER — Ambulatory Visit (HOSPITAL_COMMUNITY): Admit: 2011-04-15 | Payer: Self-pay | Admitting: Vascular Surgery

## 2011-04-15 DIAGNOSIS — H409 Unspecified glaucoma: Secondary | ICD-10-CM | POA: Insufficient documentation

## 2011-04-15 DIAGNOSIS — E039 Hypothyroidism, unspecified: Secondary | ICD-10-CM | POA: Insufficient documentation

## 2011-04-15 DIAGNOSIS — Z9861 Coronary angioplasty status: Secondary | ICD-10-CM | POA: Insufficient documentation

## 2011-04-15 DIAGNOSIS — M109 Gout, unspecified: Secondary | ICD-10-CM | POA: Insufficient documentation

## 2011-04-15 DIAGNOSIS — N186 End stage renal disease: Secondary | ICD-10-CM

## 2011-04-15 DIAGNOSIS — I2510 Atherosclerotic heart disease of native coronary artery without angina pectoris: Secondary | ICD-10-CM | POA: Insufficient documentation

## 2011-04-15 DIAGNOSIS — M159 Polyosteoarthritis, unspecified: Secondary | ICD-10-CM | POA: Insufficient documentation

## 2011-04-15 DIAGNOSIS — Z01812 Encounter for preprocedural laboratory examination: Secondary | ICD-10-CM | POA: Insufficient documentation

## 2011-04-15 DIAGNOSIS — I120 Hypertensive chronic kidney disease with stage 5 chronic kidney disease or end stage renal disease: Secondary | ICD-10-CM | POA: Insufficient documentation

## 2011-04-15 LAB — POCT I-STAT 4, (NA,K, GLUC, HGB,HCT)
Hemoglobin: 13.9 g/dL (ref 13.0–17.0)
Potassium: 4.4 mEq/L (ref 3.5–5.1)

## 2011-04-21 NOTE — Procedures (Unsigned)
CEPHALIC VEIN MAPPING  INDICATION:  Evaluation prior to placement of new access site.  HISTORY: Multiple areas of stenosis noted in the left cephalic vein above current AVF, following PTA of previous stenoses on 02/18/2011.  EXAM:  The right cephalic vein is compressible.  Diameter measurements range from 0.16 to 0.43 cm.  The right basilic vein is not evaluated.  The left cephalic vein is not evaluated.  The left basilic vein is not evaluated.  See attached worksheet for all measurements.  ADDITIONAL TECHNOLOGIST:  Fayne Norrie, RDMS, RVT.  IMPRESSION:  Patent right cephalic vein with diameter measurements as described above.  ___________________________________________ Quita Skye. Hart Rochester, M.D.  RS/MEDQ  D:  04/07/2011  T:  04/07/2011  Job:  [id number]

## 2011-04-21 NOTE — Procedures (Unsigned)
VASCULAR LAB EXAM  INDICATION:  Difficulty in using left upper extremity access site.  HISTORY: PTA of multiple areas of stenosis in left cephalic vein above AV fistula on 02/18/2011.  Diabetes:  No. Cardiac:  No. Hypertension:  Yes.  EXAM:  Duplex of left upper extremity reveals patent left AVF with apparent residual AVG portions.  Multiple areas of aneurysmal dilatation are noted and there are 2 areas of stenosis in the upper cephalic vein/subclavian vein confluence with velocities >600 cm/sec.  An area of chronic thrombotic change is noted in the left subclavian vein.  The left cephalic vein is very tortuous.  IMPRESSION:  Aneurysmal left upper arm arteriovenous fistula with multiple areas of stenosis in the cephalic vein and in the subclavian vein.  SECOND TECHNOLOGIST:  Fayne Norrie, RDMS, RVT.  ___________________________________________ Quita Skye. Hart Rochester, M.D.  RS/MEDQ  D:  04/07/2011  T:  04/07/2011  Job:  [id number]

## 2011-04-27 NOTE — Op Note (Signed)
  NAME:  Jared [name], DAVERSA NO.:  [id number]  MEDICAL RECORD NO.:  [id number]  LOCATION:  SDSC                         FACILITY:  [facility]  PHYSICIAN:  Pelham Hennick. Hart Rochester, M.D.  DATE OF BIRTH:  Mar 06, 1934  DATE OF PROCEDURE:  04/15/2011 DATE OF DISCHARGE:                              OPERATIVE REPORT   PREOPERATIVE DIAGNOSIS:  End-stage renal disease.  POSTOPERATIVE DIAGNOSIS:  End-stage renal disease.  OPERATION:  Creation of right brachial artery to cephalic vein AV fistula (Kauffman shunt).  SURGEON:  Safir Michalec. Hart Rochester, MD  FIRST ASSISTANT:  Della Goo, PA-C  ANESTHESIA:  Local.  PROCEDURE:  The patient was taken to the operating room, placed in supine position at which time the right upper extremity was prepped with Betadine scrub solution and draped in routine sterile manner.  After infiltration of 1% Xylocaine with epinephrine, transverse incision was made in the antecubital area.  The antecubital vein was completely dissected free and mobilized.  Cephalic vein was of good caliber about 3.5 mm in size.  Basilic vein was preserved.  Brachial artery exposed beneath the fascia.  It had some mild calcification, but did have an excellent pulse was of good caliber.  The radial artery palpated distally was calcified.  No heparin was given.  The cephalic vein was transected at its origin with the antecubital vein preserving the basilic branch.  The brachial artery was occluded proximally and distally with vessel loops, opened with 15 blade, extended with Potts scissors.  The vein was carefully measured, spatulated and anastomosed end-to-side with 6-0 Prolene.  Vessel loops released.  There was an excellent pulse and thrill in the upper arm fistula.  There was also radial arterial flow distally which diminished with the fistula open, but was present.  Adequate hemostasis was achieved.  Wound closed in layers with Vicryl in subcuticular fashion with Dermabond.  The  patient was taken to the recovery room in stable condition.     [name], M.D.     JDL/MEDQ  D:  04/15/2011  T:  04/15/2011  Job:  [id number]  Electronically Signed by Josephina Gip M.D. on 04/27/2011 02:16:09 PM

## 2011-05-07 LAB — CBC
MCV: 98.7
RBC: 3.87 — ABNORMAL LOW
WBC: 6.5

## 2011-05-07 LAB — DIFFERENTIAL
Lymphs Abs: 1.7
Monocytes Relative: 12
Neutro Abs: 3.8
Neutrophils Relative %: 58

## 2011-05-07 LAB — BASIC METABOLIC PANEL
Calcium: 9
Chloride: 103
Creatinine, Ser: 7.59 — ABNORMAL HIGH
GFR calc Af Amer: 9 — ABNORMAL LOW
Sodium: 140

## 2011-05-07 LAB — CK TOTAL AND CKMB (NOT AT ARMC)
CK, MB: 6.7 — ABNORMAL HIGH
Relative Index: 0.8

## 2011-05-11 ENCOUNTER — Encounter: Payer: Self-pay | Admitting: Physician Assistant

## 2011-05-12 ENCOUNTER — Encounter: Payer: Self-pay | Admitting: Physician Assistant

## 2011-05-12 LAB — COMPREHENSIVE METABOLIC PANEL
AST: 23
Albumin: 3.5
Alkaline Phosphatase: 95
Chloride: 101
Creatinine, Ser: 8.15 — ABNORMAL HIGH
GFR calc Af Amer: 8 — ABNORMAL LOW
Potassium: 4.4
Sodium: 139
Total Bilirubin: 0.8

## 2011-05-12 LAB — DIFFERENTIAL
Basophils Absolute: 0
Eosinophils Relative: 3
Lymphocytes Relative: 30
Monocytes Absolute: 0.8

## 2011-05-12 LAB — URINE MICROSCOPIC-ADD ON

## 2011-05-12 LAB — URINALYSIS, ROUTINE W REFLEX MICROSCOPIC
Bilirubin Urine: NEGATIVE
Specific Gravity, Urine: 1.016
Urobilinogen, UA: 0.2

## 2011-05-12 LAB — CBC
Platelets: 240
WBC: 6.5

## 2011-05-12 LAB — LACTIC ACID, PLASMA: Lactic Acid, Venous: 0.8

## 2011-05-13 ENCOUNTER — Ambulatory Visit: Payer: Medicare Other | Admitting: Physician Assistant

## 2011-05-14 LAB — RPR: RPR Ser Ql: NONREACTIVE

## 2011-05-15 ENCOUNTER — Other Ambulatory Visit (HOSPITAL_COMMUNITY): Payer: Self-pay | Admitting: Nephrology

## 2011-05-15 DIAGNOSIS — N186 End stage renal disease: Secondary | ICD-10-CM

## 2011-05-15 LAB — BASIC METABOLIC PANEL
Calcium: 8.6 mg/dL (ref 8.4–10.5)
GFR calc Af Amer: 7 mL/min — ABNORMAL LOW (ref >60)
GFR calc non Af Amer: 5 mL/min — ABNORMAL LOW (ref >60)
Potassium: 5 mEq/L (ref 3.5–5.1)
Sodium: 137 mEq/L (ref 135–145)

## 2011-05-15 LAB — CBC
HCT: 35.5 % — ABNORMAL LOW (ref 39.0–52.0)
Hemoglobin: 11.7 g/dL — ABNORMAL LOW (ref 13.0–17.0)
MCHC: 33 g/dL (ref 30.0–36.0)
MCV: 101.8 fL — ABNORMAL HIGH (ref 78.0–100.0)
MCV: 102.1 fL — ABNORMAL HIGH (ref 78.0–100.0)
Platelets: 209 K/uL (ref 150–400)
RBC: 3.49 MIL/uL — ABNORMAL LOW (ref 4.22–5.81)
RBC: 3.51 MIL/uL — ABNORMAL LOW (ref 4.22–5.81)
RDW: 15.1 % (ref 11.5–15.5)
WBC: 6.1 10*3/uL (ref 4.0–10.5)
WBC: 8.6 10*3/uL (ref 4.0–10.5)

## 2011-05-15 LAB — DIFFERENTIAL
Basophils Absolute: 0 10*3/uL (ref 0.0–0.1)
Basophils Relative: 1 % (ref 0–1)
Eosinophils Absolute: 0.2 10*3/uL (ref 0.0–0.7)
Eosinophils Absolute: 0.4 10*3/uL (ref 0.0–0.7)
Eosinophils Relative: 4 % (ref 0–5)
Lymphocytes Relative: 14 % (ref 12–46)
Lymphocytes Relative: 24 % (ref 12–46)
Lymphs Abs: 1.2 10*3/uL (ref 0.7–4.0)
Lymphs Abs: 1.5 10*3/uL (ref 0.7–4.0)
Monocytes Absolute: 0.9 K/uL (ref 0.1–1.0)
Monocytes Relative: 13 % — ABNORMAL HIGH (ref 3–12)
Monocytes Relative: 15 % — ABNORMAL HIGH (ref 3–12)
Neutro Abs: 3.4 K/uL (ref 1.7–7.7)
Neutrophils Relative %: 56 % (ref 43–77)
Neutrophils Relative %: 68 % (ref 43–77)

## 2011-05-15 LAB — BASIC METABOLIC PANEL WITH GFR
BUN: 33 mg/dL — ABNORMAL HIGH (ref 6–23)
CO2: 26 meq/L (ref 19–32)
Chloride: 103 meq/L (ref 96–112)
Creatinine, Ser: 9.53 mg/dL — ABNORMAL HIGH (ref 0.4–1.5)
Glucose, Bld: 90 mg/dL (ref 70–99)

## 2011-05-15 LAB — LIPID PANEL
Cholesterol: 173 mg/dL (ref 0–200)
HDL: 49 mg/dL (ref >39)
LDL Cholesterol: 110 mg/dL — ABNORMAL HIGH (ref 0–99)
Total CHOL/HDL Ratio: 3.5 RATIO
Triglycerides: 70 mg/dL (ref <150)

## 2011-05-15 LAB — CARDIAC PANEL(CRET KIN+CKTOT+MB+TROPI)
CK, MB: 3.5 ng/mL (ref 0.3–4.0)
Relative Index: 0.7 (ref 0.0–2.5)
Total CK: 514 U/L — ABNORMAL HIGH (ref 7–232)

## 2011-05-15 LAB — PROTIME-INR
INR: 1.1 (ref 0.00–1.49)
Prothrombin Time: 14.9 seconds (ref 11.6–15.2)

## 2011-05-15 LAB — CK TOTAL AND CKMB (NOT AT ARMC)
CK, MB: 4.1 ng/mL — ABNORMAL HIGH (ref 0.3–4.0)
Relative Index: 0.6 (ref 0.0–2.5)
Total CK: 641 U/L — ABNORMAL HIGH (ref 7–232)

## 2011-05-15 LAB — POCT CARDIAC MARKERS
CKMB, poc: 2.7 ng/mL (ref 1.0–8.0)
Myoglobin, poc: >500 ng/mL (ref 12–200)
Troponin i, poc: <0.05 ng/mL (ref 0.00–0.09)

## 2011-05-15 LAB — B-NATRIURETIC PEPTIDE (CONVERTED LAB): Pro B Natriuretic peptide (BNP): >3200 pg/mL — ABNORMAL HIGH (ref 0.0–100.0)

## 2011-05-15 LAB — COMPREHENSIVE METABOLIC PANEL
ALT: 19 U/L (ref 0–53)
Alkaline Phosphatase: 97 U/L (ref 39–117)
BUN: 39 mg/dL — ABNORMAL HIGH (ref 6–23)
CO2: 27 mEq/L (ref 19–32)
GFR calc non Af Amer: 5 mL/min — ABNORMAL LOW (ref >60)
Glucose, Bld: 109 mg/dL — ABNORMAL HIGH (ref 70–99)
Potassium: 4.8 mEq/L (ref 3.5–5.1)
Sodium: 142 mEq/L (ref 135–145)
Total Protein: 5.6 g/dL — ABNORMAL LOW (ref 6.0–8.3)

## 2011-05-15 LAB — POCT I-STAT, CHEM 8
BUN: 33 mg/dL — ABNORMAL HIGH (ref 6–23)
Chloride: 101 mEq/L (ref 96–112)
Creatinine, Ser: 9.5 mg/dL — ABNORMAL HIGH (ref 0.4–1.5)
Glucose, Bld: 84 mg/dL (ref 70–99)
Hemoglobin: 12.9 g/dL — ABNORMAL LOW (ref 13.0–17.0)
Potassium: 4.5 mEq/L (ref 3.5–5.1)

## 2011-05-15 LAB — TROPONIN I: Troponin I: 0.06 ng/mL (ref 0.00–0.06)

## 2011-05-15 LAB — POCT I-STAT 4, (NA,K, GLUC, HGB,HCT)
Hemoglobin: 11.2 g/dL — ABNORMAL LOW (ref 13.0–17.0)
Hemoglobin: 15.3 g/dL (ref 13.0–17.0)
Potassium: 4.5 mEq/L (ref 3.5–5.1)
Sodium: 136 mEq/L (ref 135–145)

## 2011-05-18 LAB — DIFFERENTIAL
Basophils Absolute: 0
Basophils Relative: 0
Eosinophils Absolute: 0.2
Eosinophils Relative: 3
Lymphocytes Relative: 26
Lymphs Abs: 1.8
Monocytes Absolute: 1
Monocytes Relative: 14 — ABNORMAL HIGH
Neutro Abs: 3.9
Neutrophils Relative %: 58

## 2011-05-18 LAB — URINALYSIS, ROUTINE W REFLEX MICROSCOPIC
Bilirubin Urine: NEGATIVE
Glucose, UA: NEGATIVE
Ketones, ur: NEGATIVE
Nitrite: NEGATIVE
Protein, ur: >300 — AB
Specific Gravity, Urine: 1.02
Urobilinogen, UA: 1
pH: 7.5

## 2011-05-18 LAB — BASIC METABOLIC PANEL
CO2: 27
Chloride: 98
Creatinine, Ser: 8.42 — ABNORMAL HIGH
GFR calc Af Amer: 8 — ABNORMAL LOW
Potassium: 4.8
Sodium: 140

## 2011-05-18 LAB — BASIC METABOLIC PANEL WITH GFR
BUN: 39 — ABNORMAL HIGH
Calcium: 9.6
GFR calc non Af Amer: 6 — ABNORMAL LOW
Glucose, Bld: 102 — ABNORMAL HIGH

## 2011-05-18 LAB — CBC
HCT: 40.4
Hemoglobin: 13.7
MCHC: 33.9
MCV: 101.4 — ABNORMAL HIGH
Platelets: 236
RBC: 3.99 — ABNORMAL LOW
RDW: 15.2
WBC: 6.8

## 2011-05-18 LAB — POCT CARDIAC MARKERS
CKMB, poc: 3.7
Myoglobin, poc: >500

## 2011-05-18 LAB — URINE CULTURE: Colony Count: 15000

## 2011-05-18 LAB — URINE MICROSCOPIC-ADD ON

## 2011-05-19 ENCOUNTER — Encounter: Payer: Self-pay | Admitting: Thoracic Diseases

## 2011-05-19 LAB — POCT CARDIAC MARKERS
CKMB, poc: 2.8
Myoglobin, poc: >500
Operator id: 4295

## 2011-05-19 LAB — DIFFERENTIAL
Basophils Absolute: 0
Eosinophils Absolute: 0.3
Eosinophils Relative: 5
Lymphocytes Relative: 35

## 2011-05-19 LAB — CBC
HCT: 40.7
MCV: 102.1 — ABNORMAL HIGH
Platelets: 278
RDW: 16.5 — ABNORMAL HIGH

## 2011-05-19 LAB — BASIC METABOLIC PANEL
BUN: 56 — ABNORMAL HIGH
GFR calc non Af Amer: 8 — ABNORMAL LOW
Glucose, Bld: 66 — ABNORMAL LOW
Potassium: 4.1

## 2011-05-20 ENCOUNTER — Ambulatory Visit (INDEPENDENT_AMBULATORY_CARE_PROVIDER_SITE_OTHER): Payer: Medicare Other | Admitting: Thoracic Diseases

## 2011-05-20 ENCOUNTER — Encounter: Payer: Self-pay | Admitting: Thoracic Diseases

## 2011-05-20 VITALS — BP 170/60 | HR 64 | Resp 20 | Ht 65.5 in | Wt 191.7 lb

## 2011-05-20 DIAGNOSIS — N186 End stage renal disease: Secondary | ICD-10-CM

## 2011-05-20 NOTE — Progress Notes (Signed)
[facility] [place]  Postoperative Visit hemodialysis access   Date of Surgery: 04/15/2011 Surgeon: [facility] Center: [facility]  HPI: Jared [name] is a 77 y.o. male who is 4 weeks S/P creation of right upper extremity Hemodialysis access. The patient denies symptoms of numbness, tingling, weakness and denies pain in the operative limb. Patient is here for post -op evaluation to assess healing and maturation of right brachiocephalic AVF Pt has functioning Left BC AVF .  Pt is on hemodialysis through left arm AVF on TTHS.  Physical Examination  Filed Vitals:   05/20/11 1318  BP: 170/60  Pulse: 64  Resp: 20    WDWN male in NAD.  right upper extremity Incision is clean, dry, intact Skin color is normal, no cyanosis, jaundice, pallor or bruising   Hand grip is 5/5 and sensation in digits is intact; There is a good thrill and good bruit in the right Brachiocephalic AVF. The graft/fistula is easily palpable and of adequate size  Assessment/Plan COHEN BOETTNER is a 77 y.o. year old male who presents s/p creation of right brachiocephalic Hemodialysis access. Follow-up in 8 weeks  The patient's access will be ready for use in 8 weeks.  Clinic MD: CS Edilia Bo, MD

## 2011-05-22 ENCOUNTER — Other Ambulatory Visit (HOSPITAL_COMMUNITY): Payer: Self-pay | Admitting: Nephrology

## 2011-05-22 ENCOUNTER — Ambulatory Visit (HOSPITAL_COMMUNITY)
Admission: RE | Admit: 2011-05-22 | Discharge: 2011-05-22 | Disposition: A | Discharge status: Home / Self Care | Payer: Medicare Other | Source: Ambulatory Visit | Attending: Nephrology | Admitting: Nephrology

## 2011-05-22 DIAGNOSIS — N186 End stage renal disease: Secondary | ICD-10-CM | POA: Insufficient documentation

## 2011-05-22 DIAGNOSIS — Y832 Surgical operation with anastomosis, bypass or graft as the cause of abnormal reaction of the patient, or of later complication, without mention of misadventure at the time of the procedure: Secondary | ICD-10-CM | POA: Insufficient documentation

## 2011-05-22 DIAGNOSIS — T82898A Other specified complication of vascular prosthetic devices, implants and grafts, initial encounter: Secondary | ICD-10-CM | POA: Insufficient documentation

## 2011-05-22 DIAGNOSIS — Z992 Dependence on renal dialysis: Secondary | ICD-10-CM | POA: Insufficient documentation

## 2011-05-22 LAB — POCT I-STAT 4, (NA,K, GLUC, HGB,HCT): Operator id: 155731

## 2011-05-22 MED ORDER — IOHEXOL 300 MG/ML  SOLN
100.0000 mL | Freq: Once | INTRAMUSCULAR | Status: AC | PRN
  Administered 2011-05-22: 50 mL via INTRAVENOUS

## 2011-07-20 ENCOUNTER — Encounter: Payer: Self-pay | Admitting: Vascular Surgery

## 2011-07-21 ENCOUNTER — Ambulatory Visit: Payer: Medicare Other | Admitting: Vascular Surgery

## 2011-07-24 ENCOUNTER — Encounter: Payer: Self-pay | Admitting: Surgery

## 2011-07-27 ENCOUNTER — Other Ambulatory Visit: Payer: Self-pay | Admitting: *Deleted

## 2011-07-27 ENCOUNTER — Encounter: Payer: Self-pay | Admitting: Surgery

## 2011-07-27 ENCOUNTER — Other Ambulatory Visit (INDEPENDENT_AMBULATORY_CARE_PROVIDER_SITE_OTHER): Payer: Medicare Other | Admitting: *Deleted

## 2011-07-27 ENCOUNTER — Ambulatory Visit (INDEPENDENT_AMBULATORY_CARE_PROVIDER_SITE_OTHER): Payer: Medicare Other | Admitting: Surgery

## 2011-07-27 VITALS — BP 183/78 | HR 70 | Resp 24 | Ht 65.5 in | Wt 197.5 lb

## 2011-07-27 DIAGNOSIS — T82898A Other specified complication of vascular prosthetic devices, implants and grafts, initial encounter: Secondary | ICD-10-CM

## 2011-07-27 DIAGNOSIS — N186 End stage renal disease: Secondary | ICD-10-CM

## 2011-07-27 NOTE — Progress Notes (Signed)
[facility] [place]   Patient name: Jared [name] MRN: [id number] DOB: July 05, 1934 Sex: male     Chief Complaint  Patient presents with  . 2 mo f/u    s/p right B-C AVF on 04/15/11    HISTORY OF PRESENT ILLNESS: The patient is here today for followup of his right brachiocephalic fistula placed by Dr. Hart Rochester in August of this year. The patient currently dialyzes through a left upper arm fistula. The right that has not been used as of yet. He has had multiple interventions on the left. It is failing and that's why the right-sided access was placed.  Past Medical History  Diagnosis Date  . Psychosexual dysfunction with inhibited sexual excitement   . Other specified personal history presenting hazards to health   . Generalized osteoarthrosis, unspecified site   . Osteoarthrosis, unspecified whether generalized or localized, unspecified site   . 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 Unspecified hypothyroidism   . Gout   . CAD (coronary artery disease)   . Unspecified deficiency anemia   . Unspecified essential hypertension   . End stage renal disease   . Myocardial infarction 1964    Past Surgical History  Procedure Date  . Cabg     1994  . Av fistula placement   . Coronary artery bypass graft     History   Social History  . Marital Status: Legally Separated    Spouse Name: N/A    Number of Children: N/A  . Years of Education: N/A   Occupational History  . retired    Social History Main Topics  . Smoking status: Former Smoker -- 15 years    Types: Cigarettes    Quit date: 04/05/1961  . Smokeless tobacco: Never Used  . Alcohol Use: No  . Drug Use: No  . Sexually Active: Not on file   Other Topics Concern  . Not on file   Social History Narrative  . No narrative on file    Family History  Problem Relation Age of Onset  . Heart disease Mother   . Heart disease Father   . Heart disease Sister      Allergies as of 07/27/2011  . (No Known Allergies)    Current Outpatient Prescriptions on File Prior to Visit  Medication Sig Dispense Refill  . acetaminophen (TYLENOL) 500 MG tablet Take 500 mg by mouth every 6 (six) hours as needed.        Marland Kitchen amLODipine (NORVASC) 10 MG tablet Take 10 mg by mouth daily.        Marland Kitchen aspirin 81 MG tablet Take 81 mg by mouth. Take 2 tabs daily      . B Complex-C-Folic Acid (DIALYVITE PO) Take 1 tablet by mouth daily.        . cinacalcet (SENSIPAR) 30 MG tablet Take 30 mg by mouth daily.        . clopidogrel (PLAVIX) 75 MG tablet Take 75 mg by mouth daily.        . famotidine (PEPCID) 20 MG tablet Take 20 mg by mouth daily.        . Glucosamine-Chondroit-Vit C-Mn (GLUCOSAMINE 1500 COMPLEX) CAPS Take 1 capsule by mouth daily.        . isosorbide mononitrate (IMDUR) 60 MG 24 hr tablet Take 60 mg by mouth daily.        . Levothyroxine Sodium 88 MCG CAPS Take 1 capsule by mouth daily.        Marland Kitchen  Multiple Vitamin (MULTIVITAMIN) tablet Take 1 tablet by mouth daily.        . niacin 500 MG CR capsule Take 500 mg by mouth 2 (two) times daily.        . nitroGLYCERIN (NITROSTAT) 0.4 MG SL tablet Place 0.4 mg under the tongue every 5 (five) minutes as needed.        . traMADol (ULTRAM) 50 MG tablet Take 50 mg by mouth every 6 (six) hours as needed.        . travoprost, benzalkonium, (TRAVATAN Z) 0.004 % ophthalmic solution 1 drop as directed.           REVIEW OF SYSTEMS: No changes  PHYSICAL EXAMINATION:   Vital signs are BP 183/78  Pulse 70  Resp 24  Ht 5' 5.5" (1.664 m)  Wt 197 lb 8 oz (89.585 kg)  BMI 32.37 kg/m2 General: The patient appears their stated age. HEENT:  No gross abnormalities Pulmonary:  Non labored breathing Musculoskeletal: There are no major deformities. Neurologic: No focal weakness or paresthesias are detected, Skin: There are no ulcer or rashes noted. Psychiatric: The patient has normal affect. Cardiovascular: The fistula is palpable  however the thrill is rather faint.   Diagnostic Studies Ultrasound official was performed today there appears to be a high-grade stenosis about 7 cm proximal to the incision in his antecubital area. Just proximal to the arteriovenous anastomosis there is also a irregularity within the lumen consistent with some form of a flap either a valve or focal dissection both of these appear to significantly impact the fistula. The vein diameter is about 0.4 cm  Assessment: Non-maturing right upper arm fistula Plan: After reviewing the patient's ultrasound I feel that we need to proceed with a fistulogram. I schedule this for this Wednesday, December 19. I will need to access the fistula up near the the shoulder with the sheath pointed down toward his hand. I will  perform a fistulogram and intervention if possible  V. Charlena Cross, M.D. [facility] [place] Office: [phone] Pager:  [phone]

## 2011-07-28 MED ORDER — SODIUM CHLORIDE 0.9 % IJ SOLN: 3.0000 mL | INTRAMUSCULAR | Status: DC | PRN

## 2011-07-29 ENCOUNTER — Encounter (HOSPITAL_COMMUNITY)
Admission: RE | Disposition: A | Discharge status: Home / Self Care | Payer: Self-pay | Source: Ambulatory Visit | Attending: Surgery

## 2011-07-29 ENCOUNTER — Ambulatory Visit (HOSPITAL_COMMUNITY)
Admission: RE | Admit: 2011-07-29 | Discharge: 2011-07-29 | Disposition: A | Discharge status: Home / Self Care | Payer: Medicare Other | Source: Ambulatory Visit | Attending: Surgery | Admitting: Surgery

## 2011-07-29 DIAGNOSIS — T82898A Other specified complication of vascular prosthetic devices, implants and grafts, initial encounter: Secondary | ICD-10-CM

## 2011-07-29 DIAGNOSIS — N186 End stage renal disease: Secondary | ICD-10-CM

## 2011-07-29 DIAGNOSIS — T82598A Other mechanical complication of other cardiac and vascular devices and implants, initial encounter: Secondary | ICD-10-CM | POA: Insufficient documentation

## 2011-07-29 DIAGNOSIS — I10 Essential (primary) hypertension: Secondary | ICD-10-CM | POA: Insufficient documentation

## 2011-07-29 DIAGNOSIS — Y849 Medical procedure, unspecified as the cause of abnormal reaction of the patient, or of later complication, without mention of misadventure at the time of the procedure: Secondary | ICD-10-CM | POA: Insufficient documentation

## 2011-07-29 DIAGNOSIS — I2510 Atherosclerotic heart disease of native coronary artery without angina pectoris: Secondary | ICD-10-CM | POA: Insufficient documentation

## 2011-07-29 DIAGNOSIS — M1990 Unspecified osteoarthritis, unspecified site: Secondary | ICD-10-CM | POA: Insufficient documentation

## 2011-07-29 DIAGNOSIS — I252 Old myocardial infarction: Secondary | ICD-10-CM | POA: Insufficient documentation

## 2011-07-29 HISTORY — PX: SHUNTOGRAM: SHX5491

## 2011-07-29 LAB — POCT I-STAT, CHEM 8
Chloride: 104 mEq/L (ref 96–112)
Glucose, Bld: 74 mg/dL (ref 70–99)
HCT: 38 % — ABNORMAL LOW (ref 39.0–52.0)
Hemoglobin: 12.9 g/dL — ABNORMAL LOW (ref 13.0–17.0)
Potassium: 4.9 mEq/L (ref 3.5–5.1)
Sodium: 140 mEq/L (ref 135–145)

## 2011-07-29 SURGERY — ASSESSMENT, SHUNT FUNCTION, WITH CONTRAST RADIOGRAPHIC STUDY
Anesthesia: LOCAL

## 2011-07-29 MED ORDER — FENTANYL CITRATE 0.05 MG/ML IJ SOLN
INTRAMUSCULAR | Status: AC
  Filled 2011-07-29: qty 2

## 2011-07-29 MED ORDER — HEPARIN (PORCINE) IN NACL 2-0.9 UNIT/ML-% IJ SOLN
INTRAMUSCULAR | Status: AC
  Filled 2011-07-29: qty 1000

## 2011-07-29 MED ORDER — LIDOCAINE HCL (PF) 1 % IJ SOLN
INTRAMUSCULAR | Status: AC
  Filled 2011-07-29: qty 30

## 2011-07-29 NOTE — H&P (View-Only) (Signed)
[facility] [place]   Patient name: Jared [name] MRN: [id number] DOB: 01/20/1934 Sex: male     Chief Complaint  Patient presents with  . 2 mo f/u    s/p right B-C AVF on 04/15/11    HISTORY OF PRESENT ILLNESS: The patient is here today for followup of his right brachiocephalic fistula placed by Dr. Lawson in August of this year. The patient currently dialyzes through a left upper arm fistula. The right that has not been used as of yet. He has had multiple interventions on the left. It is failing and that's why the right-sided access was placed.  Past Medical History  Diagnosis Date  . Psychosexual dysfunction with inhibited sexual excitement   . Other specified personal history presenting hazards to health   . Generalized osteoarthrosis, unspecified site   . Osteoarthrosis, unspecified whether generalized or localized, unspecified site   . 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 Unspecified hypothyroidism   . Gout   . CAD (coronary artery disease)   . Unspecified deficiency anemia   . Unspecified essential hypertension   . End stage renal disease   . Myocardial infarction 1964    Past Surgical History  Procedure Date  . Cabg     1994  . Av fistula placement   . Coronary artery bypass graft     History   Social History  . Marital Status: Legally Separated    Spouse Name: N/A    Number of Children: N/A  . Years of Education: N/A   Occupational History  . retired    Social History Main Topics  . Smoking status: Former Smoker -- 15 years    Types: Cigarettes    Quit date: 04/05/1961  . Smokeless tobacco: Never Used  . Alcohol Use: No  . Drug Use: No  . Sexually Active: Not on file   Other Topics Concern  . Not on file   Social History Narrative  . No narrative on file    Family History  Problem Relation Age of Onset  . Heart disease Mother   . Heart disease Father   . Heart disease Sister      Allergies as of 07/27/2011  . (No Known Allergies)    Current Outpatient Prescriptions on File Prior to Visit  Medication Sig Dispense Refill  . acetaminophen (TYLENOL) 500 MG tablet Take 500 mg by mouth every 6 (six) hours as needed.        . amLODipine (NORVASC) 10 MG tablet Take 10 mg by mouth daily.        . aspirin 81 MG tablet Take 81 mg by mouth. Take 2 tabs daily      . B Complex-C-Folic Acid (DIALYVITE PO) Take 1 tablet by mouth daily.        . cinacalcet (SENSIPAR) 30 MG tablet Take 30 mg by mouth daily.        . clopidogrel (PLAVIX) 75 MG tablet Take 75 mg by mouth daily.        . famotidine (PEPCID) 20 MG tablet Take 20 mg by mouth daily.        . Glucosamine-Chondroit-Vit C-Mn (GLUCOSAMINE 1500 COMPLEX) CAPS Take 1 capsule by mouth daily.        . isosorbide mononitrate (IMDUR) 60 MG 24 hr tablet Take 60 mg by mouth daily.        . Levothyroxine Sodium 88 MCG CAPS Take 1 capsule by mouth daily.        .   Multiple Vitamin (MULTIVITAMIN) tablet Take 1 tablet by mouth daily.        . niacin 500 MG CR capsule Take 500 mg by mouth 2 (two) times daily.        . nitroGLYCERIN (NITROSTAT) 0.4 MG SL tablet Place 0.4 mg under the tongue every 5 (five) minutes as needed.        . traMADol (ULTRAM) 50 MG tablet Take 50 mg by mouth every 6 (six) hours as needed.        . travoprost, benzalkonium, (TRAVATAN Z) 0.004 % ophthalmic solution 1 drop as directed.           REVIEW OF SYSTEMS: No changes  PHYSICAL EXAMINATION:   Vital signs are BP 183/78  Pulse 70  Resp 24  Ht 5' 5.5" (1.664 m)  Wt 197 lb 8 oz (89.585 kg)  BMI 32.37 kg/m2 General: The patient appears their stated age. HEENT:  No gross abnormalities Pulmonary:  Non labored breathing Musculoskeletal: There are no major deformities. Neurologic: No focal weakness or paresthesias are detected, Skin: There are no ulcer or rashes noted. Psychiatric: The patient has normal affect. Cardiovascular: The fistula is palpable  however the thrill is rather faint.   Diagnostic Studies Ultrasound official was performed today there appears to be a high-grade stenosis about 7 cm proximal to the incision in his antecubital area. Just proximal to the arteriovenous anastomosis there is also a irregularity within the lumen consistent with some form of a flap either a valve or focal dissection both of these appear to significantly impact the fistula. The vein diameter is about 0.4 cm  Assessment: Non-maturing right upper arm fistula Plan: After reviewing the patient's ultrasound I feel that we need to proceed with a fistulogram. I schedule this for this Wednesday, December 19. I will need to access the fistula up near the the shoulder with the sheath pointed down toward his hand. I will  perform a fistulogram and intervention if possible  V. Wells Tilia Faso IV, M.D. [facility] [place] Office: [phone] Pager:  [phone]    

## 2011-07-29 NOTE — Progress Notes (Signed)
Notified Dr Myra Gianotti that Pt scheduled for Rt arm shuntogram, asked where he would like IV, he stated to try Right hand 1st if not able may use Left hand, pt also has fistula in Lt arm

## 2011-07-29 NOTE — Op Note (Signed)
[facility] [place]  Patient name: Jared [name] MRN: [id number] DOB: 1934-03-09 Sex: male  07/29/2011 Pre-operative Diagnosis: Non-maturing right upper arm AV fistula Post-operative diagnosis:  Same Surgeon:  Jorge Ny Procedure Performed:  1.  ultrasound access right arm fistula  2.  fistulogram  3.  angioplasty right cephalic vein     Indications:  The patient presented to the office recently with a non-maturing right upper arm AV fistula. Ultrasound identified a stenosis 7 cm above the arteriovenous anastomosis as well as what appeared to be a flap within the lumen of the vein. He comes in today for intervention  Procedure:  The patient was identified in the holding area and taken to room 8.  The patient was then placed supine on the table and prepped and draped in the usual sterile fashion.  A time out was called.  Ultrasound was used to evaluate the fistula.  The vein was patent and compressible.  A digital ultrasound image was acquired.  The fistula was then accessed under ultrasound guidance using a micropuncture needle.  An 018 wire was then asvanced without resistance and a micropuncture sheath was placed.  Contrast injections were then performed through the sheath.  Findings:  The fistula is patent throughout it's course. There is no evidence of central venous stenosis. There is an area of stenosis which correlates with the ultrasound findings approximately 7 cm above the antecubital crease.   Intervention:  Over a Transport planner a 6 French sheath was placed. A 5 x 4 Mustang balloon was used to perform balloon angioplasty in the right cephalic vein the balloon was taken to 6 atmospheres and held for 1 minute. Followup study revealed improved results across the area of stenosis. There is a better thrill within the fistula. The decision was made to terminate the procedure at this point.  Impression:  #1  no central venous stenosis  #2  cephalic vein  stenosis just proximal to the anastomosis which was successfully dilated with a 5 mm balloon  #3  patient return to the office in one month with a followup ultrasound   V. Durene Cal, M.D. [facility] [place] Office: [phone] Pager:  [phone]

## 2011-07-29 NOTE — Interval H&P Note (Signed)
History and Physical Interval Note:  07/29/2011 9:33 AM  Jared [name]  has presented today for surgery, with the diagnosis of PVD   The various methods of treatment have been discussed with the patient and family. After consideration of risks, benefits and other options for treatment, the patient has consented to  Procedure(s): SHUNTOGRAM as a surgical intervention .  The patients' history has been reviewed, patient examined, no change in status, stable for surgery.  I have reviewed the patients' chart and labs.  Questions were answered to the patient's satisfaction.     BRABHAM IV, V. WELLS

## 2011-08-17 NOTE — Procedures (Unsigned)
VASCULAR LAB EXAM  INDICATION:  Nonmaturing right brachiocephalic fistula placed, 04/15/11.  HISTORY: Diabetes: Cardiac: Hypertension:  EXAM: 1. Patent right brachiocephalic fistula with irregular web-like     filling defect at the proximal portion with elevated velocities     observed.  In the proximal to mid segment, approximately 7 cm     distal to the anastomosis is a narrowed area with velocities that     exceed machine scale.  End-diastolic velocity is 418 cm/s.  In the     mid to distal segment, there is an area of elevated velocity that     appear to be a valve.  Velocity are 268 cm/s. 2. Please see attached diagram for details.  IMPRESSION:  Patent right brachiocephalic fistula with web-like filling defect in the proximal segment and anatomic narrowing at the proximal to mid segment and elevated velocities in the mid to distal segment.  ___________________________________________ V. Charlena Cross, MD  LT/MEDQ  D:  07/28/2011  T:  07/28/2011  Job:  [id number]

## 2011-08-28 ENCOUNTER — Encounter: Payer: Self-pay | Admitting: Surgery

## 2011-08-31 ENCOUNTER — Ambulatory Visit: Payer: Medicare Other | Admitting: Surgery

## 2011-08-31 ENCOUNTER — Other Ambulatory Visit: Payer: Medicare Other

## 2011-09-16 ENCOUNTER — Encounter (HOSPITAL_COMMUNITY): Payer: Self-pay | Admitting: Emergency Medicine

## 2011-09-16 ENCOUNTER — Emergency Department (HOSPITAL_COMMUNITY)
Admission: EM | Admit: 2011-09-16 | Discharge: 2011-09-16 | Disposition: A | Discharge status: Home / Self Care | Payer: Medicare Other | Attending: Emergency Medicine | Admitting: Emergency Medicine

## 2011-09-16 DIAGNOSIS — Z862 Personal history of diseases of the blood and blood-forming organs and certain disorders involving the immune mechanism: Secondary | ICD-10-CM | POA: Insufficient documentation

## 2011-09-16 DIAGNOSIS — I252 Old myocardial infarction: Secondary | ICD-10-CM | POA: Insufficient documentation

## 2011-09-16 DIAGNOSIS — Z79899 Other long term (current) drug therapy: Secondary | ICD-10-CM | POA: Insufficient documentation

## 2011-09-16 DIAGNOSIS — N186 End stage renal disease: Secondary | ICD-10-CM | POA: Insufficient documentation

## 2011-09-16 DIAGNOSIS — M1990 Unspecified osteoarthritis, unspecified site: Secondary | ICD-10-CM | POA: Insufficient documentation

## 2011-09-16 DIAGNOSIS — Z8639 Personal history of other endocrine, nutritional and metabolic disease: Secondary | ICD-10-CM | POA: Insufficient documentation

## 2011-09-16 DIAGNOSIS — Z992 Dependence on renal dialysis: Secondary | ICD-10-CM

## 2011-09-16 DIAGNOSIS — I2510 Atherosclerotic heart disease of native coronary artery without angina pectoris: Secondary | ICD-10-CM | POA: Insufficient documentation

## 2011-09-16 DIAGNOSIS — W010XXA Fall on same level from slipping, tripping and stumbling without subsequent striking against object, initial encounter: Secondary | ICD-10-CM | POA: Insufficient documentation

## 2011-09-16 DIAGNOSIS — M79602 Pain in left arm: Secondary | ICD-10-CM

## 2011-09-16 DIAGNOSIS — Y92410 Unspecified street and highway as the place of occurrence of the external cause: Secondary | ICD-10-CM | POA: Insufficient documentation

## 2011-09-16 DIAGNOSIS — I120 Hypertensive chronic kidney disease with stage 5 chronic kidney disease or end stage renal disease: Secondary | ICD-10-CM | POA: Insufficient documentation

## 2011-09-16 DIAGNOSIS — W19XXXA Unspecified fall, initial encounter: Secondary | ICD-10-CM

## 2011-09-16 DIAGNOSIS — S80212A Abrasion, left knee, initial encounter: Secondary | ICD-10-CM

## 2011-09-16 DIAGNOSIS — IMO0002 Reserved for concepts with insufficient information to code with codable children: Secondary | ICD-10-CM | POA: Insufficient documentation

## 2011-09-16 DIAGNOSIS — Z7982 Long term (current) use of aspirin: Secondary | ICD-10-CM | POA: Insufficient documentation

## 2011-09-16 DIAGNOSIS — Z951 Presence of aortocoronary bypass graft: Secondary | ICD-10-CM | POA: Insufficient documentation

## 2011-09-16 MED ORDER — HYDROCODONE-ACETAMINOPHEN 5-500 MG PO TABS: 1.0000 | ORAL_TABLET | Freq: Four times a day (QID) | ORAL | Status: AC | PRN

## 2011-09-16 NOTE — ED Notes (Signed)
Pt states he tripped and fell yesterday in the road. Pt states EMS responded but he refused transport to hospital. Pt states today, he was sitting in [employer] and began hurting all over from fall so he asked someone to bring him here.  Pt states he is also having pain around his dialysis shunt and would like that checked too. Pt states he had dialysis yesterday.

## 2011-09-16 NOTE — ED Provider Notes (Addendum)
History     CSN: [id number]  Arrival date & time 09/16/11  1055   First MD Initiated Contact with Patient 09/16/11 1118      Chief Complaint  Patient presents with  . Fall    tripped and fell in road yesterday, now having generalized body aches    (Consider location/radiation/quality/duration/timing/severity/associated sxs/prior treatment) Patient is a 78 y.o. male presenting with fall. The history is provided by the patient.  Fall The accident occurred yesterday. The fall occurred while walking. He fell from a height of 1 to 2 ft. He landed on concrete. There was no blood loss. The point of impact was the left knee (left arm). The pain is moderate. He was ambulatory at the scene. The symptoms are aggravated by activity, standing and ambulation. He has tried nothing for the symptoms.    Past Medical History  Diagnosis Date  . Psychosexual dysfunction with inhibited sexual excitement   . Other specified personal history presenting hazards to health   . Generalized osteoarthrosis, unspecified site   . Osteoarthrosis, unspecified whether generalized or localized, unspecified site   . 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 Unspecified hypothyroidism   . Gout   . CAD (coronary artery disease)   . Unspecified deficiency anemia   . Unspecified essential hypertension   . End stage renal disease   . Myocardial infarction 1964    Past Surgical History  Procedure Date  . Cabg     1994  . Av fistula placement   . Coronary artery bypass graft     Family History  Problem Relation Age of Onset  . Heart disease Mother   . Heart disease Father   . Heart disease Sister     History  Substance Use Topics  . Smoking status: Former Smoker -- 15 years    Types: Cigarettes    Quit date: 04/05/1961  . Smokeless tobacco: Never Used  . Alcohol Use: No      Review of Systems  All other systems reviewed and are negative.    Allergies    Review of patient's allergies indicates no known allergies.  Home Medications   Current Outpatient Rx  Name Route Sig Dispense Refill  . ACETAMINOPHEN 500 MG PO TABS Oral Take 1,000 mg by mouth 2 (two) times daily.     Marland Kitchen AMLODIPINE BESYLATE 10 MG PO TABS Oral Take 10 mg by mouth daily.      . ASPIRIN 81 MG PO TABS Oral Take 81 mg by mouth. Take 2 tabs daily    . DIALYVITE PO Oral Take 1 tablet by mouth daily.      Marland Kitchen CALCIUM ACETATE 667 MG PO CAPS Oral Take 1,334 mg by mouth 3 (three) times daily with meals.      Marland Kitchen CALCIUM CARBONATE ANTACID 1000 MG PO CHEW Oral Chew 1,000 mg by mouth daily as needed. For heartburn     . CINACALCET HCL 30 MG PO TABS Oral Take 30 mg by mouth daily.      Marland Kitchen CLOPIDOGREL BISULFATE 75 MG PO TABS Oral Take 75 mg by mouth daily.      Marland Kitchen FAMOTIDINE 20 MG PO TABS Oral Take 20 mg by mouth daily.      . GUAIFENESIN ER 600 MG PO TB12 Oral Take 600 mg by mouth 2 (two) times daily as needed. For cough & congestion     . LABETALOL HCL 300 MG PO TABS Oral Take 300 mg by  mouth 2 (two) times daily.      Marland Kitchen LEVOTHYROXINE SODIUM 88 MCG PO CAPS Oral Take 1 capsule by mouth daily.      Marland Kitchen METOPROLOL SUCCINATE ER 50 MG PO TB24 Oral Take 50 mg by mouth daily.      Marland Kitchen ONE-DAILY MULTI VITAMINS PO TABS Oral Take 1 tablet by mouth daily.      Marland Kitchen NAPROXEN SODIUM 220 MG PO TABS Oral Take 220 mg by mouth 2 (two) times daily.      Marland Kitchen NIACIN ER (ANTIHYPERLIPIDEMIC) 500 MG PO TBCR Oral Take 500 mg by mouth daily.      Marland Kitchen NITROGLYCERIN 0.4 MG/SPRAY TL SOLN Sublingual Place 1 spray under the tongue every 5 (five) minutes as needed. For chest pain     . TRAVOPROST 0.004 % OP SOLN Both Eyes Place 1 drop into both eyes every morning.       BP 174/103  Pulse 74  Temp(Src) 97.6 F (36.4 C) (Oral)  Resp 18  Ht 5\' 5"  (1.651 m)  Wt 200 lb (90.719 kg)  BMI 33.28 kg/m2  SpO2 99%  Physical Exam  Nursing note and vitals reviewed. Constitutional: He is oriented to person, place, and time. He appears  well-developed and well-nourished. No distress.  HENT:  Head: Normocephalic and atraumatic.  Eyes: EOM are normal. Pupils are equal, round, and reactive to light.  Neck: Normal range of motion. Neck supple.  Cardiovascular: Normal rate and regular rhythm.   No murmur heard. Pulmonary/Chest: Effort normal and breath sounds normal. No respiratory distress.  Abdominal: Soft. Bowel sounds are normal. He exhibits no distension.  Musculoskeletal: Normal range of motion.       The left forearm has a fistula for dialysis that appears well.  There is no bleeding and distal pulses are intact.  The left knee has an abrasion but no effusion.  It is stable AP/laterally and has good rom without limitation.  Neurological: He is alert and oriented to person, place, and time. No cranial nerve deficit.  Skin: Skin is warm and dry. He is not diaphoretic.    ED Course  Procedures (including critical care time)  Labs Reviewed - No data to display No results found.   No diagnosis found.    MDM  Patient seems fine, just wants something for "soreness".          Geoffery Lyons, MD 09/16/11 1610  Geoffery Lyons, MD 09/16/11 1130

## 2011-09-17 ENCOUNTER — Encounter: Payer: Self-pay | Admitting: Physician Assistant

## 2011-09-18 ENCOUNTER — Ambulatory Visit: Payer: Medicare Other | Admitting: Vascular Surgery

## 2011-09-18 ENCOUNTER — Ambulatory Visit (INDEPENDENT_AMBULATORY_CARE_PROVIDER_SITE_OTHER): Payer: Medicare Other | Admitting: Vascular Surgery

## 2011-09-18 ENCOUNTER — Encounter (INDEPENDENT_AMBULATORY_CARE_PROVIDER_SITE_OTHER): Payer: Medicare Other | Admitting: *Deleted

## 2011-09-18 ENCOUNTER — Ambulatory Visit: Payer: Medicare Other

## 2011-09-18 ENCOUNTER — Encounter: Payer: Self-pay | Admitting: Vascular Surgery

## 2011-09-18 VITALS — BP 242/100 | HR 66 | Resp 18 | Ht 65.0 in | Wt 200.0 lb

## 2011-09-18 DIAGNOSIS — T82598A Other mechanical complication of other cardiac and vascular devices and implants, initial encounter: Secondary | ICD-10-CM

## 2011-09-18 DIAGNOSIS — N186 End stage renal disease: Secondary | ICD-10-CM

## 2011-09-18 NOTE — Progress Notes (Signed)
[facility] [place]  Postoperative Visit  History of Present Illness  Jared [name] is a 78 y.o. male who presents for postoperative follow-up for: cephalic vein venoplasty for venous stenosis in brachiocephalic arteriovenous fistula (Date: 07/29/11).  The patient's wounds are healed.  The patient notes no steal symptoms.  The patient is able to complete their activities of daily living.  The patient's current symptoms are: none.  The patient still has not had dialysis via the fistula.    Physical Examination  Filed Vitals:   09/18/11 1447  BP: 242/100  Pulse: 66  Resp: 18  Height: 5\' 5"  (1.651 m)  Weight: 200 lb (90.719 kg)  SpO2: 99%    General: A&O x 3, WDWN  Pulmonary: Sym exp, good air movt, CTAB, no rales, rhonchi, & wheezing  Cardiac: RRR, Nl S1, S2, no Murmurs, rubs or gallops  Vascular: Vessel Right Left  Radial Palpable Palpable  [place] Palpable Palpable  Brachial Palpable Palpable  Carotid Palpable, without bruit Palpable, without bruit  Aorta Non-palpable N/A  Femoral Palpable Palpable  Popliteal Non-palpable Non-palpable  PT Palpable Palpable  DP Palpable Palpable   Gastrointestinal: soft, NTND, -G/R, - HSM, - masses, - CVAT B  Musculoskeletal: M/S 5/5 throughout , Extremities without ischemic changes , right brachiocephalic with strong thrill and bruit throughout, puncture site healed  Neurologic: Pain and light touch intact in extremities , Motor exam as listed above  Non-invasive Studies: R arm fistula duplex (Date: 09/18/11):  Multiple leaflets with increased velocities, re-current web like structure as previously seen  0.53-0.75 mm in diameter  Medical Decision Making  Jared [name] is a 78 y.o. male who presents s/p R cephalic vein venoplasty   I think it is worth trying to cannulate this fistula.   There is enough length palpable though the proximal segment is deeper.  Also the size is on the margin of acceptable.    If the flow rates are inadequate, this fistula will need a new fistulogram with intervention (basically a BAM).  Thank you for allowing [place] to participate in this patient's care.  Leonides Sake, MD [facility] [place] Office: [phone] Pager: [phone]

## 2011-09-22 ENCOUNTER — Emergency Department (HOSPITAL_COMMUNITY)
Admission: EM | Admit: 2011-09-22 | Discharge: 2011-09-22 | Disposition: A | Discharge status: Home / Self Care | Payer: Medicare Other | Attending: Emergency Medicine | Admitting: Emergency Medicine

## 2011-09-22 ENCOUNTER — Encounter (HOSPITAL_COMMUNITY): Payer: Self-pay | Admitting: Emergency Medicine

## 2011-09-22 DIAGNOSIS — R259 Unspecified abnormal involuntary movements: Secondary | ICD-10-CM | POA: Insufficient documentation

## 2011-09-22 DIAGNOSIS — Z992 Dependence on renal dialysis: Secondary | ICD-10-CM | POA: Insufficient documentation

## 2011-09-22 DIAGNOSIS — G252 Other specified forms of tremor: Secondary | ICD-10-CM

## 2011-09-22 DIAGNOSIS — I2510 Atherosclerotic heart disease of native coronary artery without angina pectoris: Secondary | ICD-10-CM | POA: Insufficient documentation

## 2011-09-22 DIAGNOSIS — N186 End stage renal disease: Secondary | ICD-10-CM | POA: Insufficient documentation

## 2011-09-22 DIAGNOSIS — R5381 Other malaise: Secondary | ICD-10-CM | POA: Insufficient documentation

## 2011-09-22 DIAGNOSIS — I252 Old myocardial infarction: Secondary | ICD-10-CM | POA: Insufficient documentation

## 2011-09-22 DIAGNOSIS — I120 Hypertensive chronic kidney disease with stage 5 chronic kidney disease or end stage renal disease: Secondary | ICD-10-CM | POA: Insufficient documentation

## 2011-09-22 DIAGNOSIS — E039 Hypothyroidism, unspecified: Secondary | ICD-10-CM | POA: Insufficient documentation

## 2011-09-22 HISTORY — DX: Dorsalgia, unspecified: M54.9

## 2011-09-22 LAB — GLUCOSE, CAPILLARY: Glucose-Capillary: 106 mg/dL — ABNORMAL HIGH (ref 70–99)

## 2011-09-22 LAB — POCT I-STAT, CHEM 8
Chloride: 98 mEq/L (ref 96–112)
HCT: 35 % — ABNORMAL LOW (ref 39.0–52.0)
Hemoglobin: 11.9 g/dL — ABNORMAL LOW (ref 13.0–17.0)
Potassium: 3.6 mEq/L (ref 3.5–5.1)
Sodium: 142 mEq/L (ref 135–145)

## 2011-09-22 MED ORDER — LORAZEPAM 2 MG/ML IJ SOLN
1.0000 mg | Freq: Once | INTRAMUSCULAR | Status: AC
  Administered 2011-09-22: 1 mg via INTRAVENOUS
  Filled 2011-09-22: qty 1

## 2011-09-22 MED ORDER — LORAZEPAM 1 MG PO TABS: 1.0000 mg | ORAL_TABLET | Freq: Three times a day (TID) | ORAL | Status: AC | PRN

## 2011-09-22 NOTE — ED Provider Notes (Signed)
History     CSN: [id number]  Arrival date & time 09/22/11  1405   First MD Initiated Contact with Patient 09/22/11 1407      Chief Complaint  Patient presents with  . Weakness    (Consider location/radiation/quality/duration/timing/severity/associated sxs/prior treatment) HPI Comments: Patient presents from dialysis with facial tremors as well as other tremors in his body.  The patient states he's had this once before proximally 6 months ago and they did spontaneously resolve.  Patient has had an intermittently throughout the last several hours in the tremors will last only a few seconds and then spontaneously resolve.  Patient notes no changes in his medications and that he did receive his normal complete dialysis course today.  He was apparently going home on the bus today and while they get worse there he ended up coming here to the emergency department.  He notes no other changes in medications, no fevers, no nausea or vomiting the chest pain or shortness of breath.  Patient has no specific seizure history.  Patient is alert and oriented and able to talk through all of these tremors.  Patient is a 78 y.o. male presenting with weakness. The history is provided by the patient. No language interpreter was used.  Weakness Primary symptoms do not include headaches, syncope, loss of consciousness, altered mental status, fever, nausea or vomiting. The symptoms began 2 to 6 hours ago. The symptoms are unchanged. The neurological symptoms are multifocal.  Additional symptoms do not include weakness.    Past Medical History  Diagnosis Date  . Psychosexual dysfunction with inhibited sexual excitement   . Other specified personal history presenting hazards to health   . Generalized osteoarthrosis, unspecified site   . Osteoarthrosis, unspecified whether generalized or localized, unspecified site   . 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 Unspecified  hypothyroidism   . Gout   . CAD (coronary artery disease)   . Unspecified deficiency anemia   . Unspecified essential hypertension   . End stage renal disease   . Myocardial infarction 1964  . Back pain     Past Surgical History  Procedure Date  . Cabg     1994  . Av fistula placement   . Coronary artery bypass graft     Family History  Problem Relation Age of Onset  . Heart disease Mother   . Heart disease Father   . Heart disease Sister     History  Substance Use Topics  . Smoking status: Former Smoker -- 15 years    Types: Cigarettes    Quit date: 04/05/1961  . Smokeless tobacco: Never Used  . Alcohol Use: No      Review of Systems  Constitutional: Negative.  Negative for fever and chills.  HENT: Negative.   Eyes: Negative.  Negative for discharge and redness.  Respiratory: Negative.  Negative for cough and shortness of breath.   Cardiovascular: Negative.  Negative for chest pain and syncope.  Gastrointestinal: Negative.  Negative for nausea, vomiting and abdominal pain.  Genitourinary: Negative.  Negative for hematuria.  Musculoskeletal: Negative.  Negative for back pain.  Skin: Negative.  Negative for color change and rash.  Neurological: Positive for tremors. Negative for loss of consciousness, syncope, weakness and headaches.  Hematological: Negative.  Negative for adenopathy.  Psychiatric/Behavioral: Negative.  Negative for confusion and altered mental status.  All other systems reviewed and are negative.    Allergies  Review of patient's allergies indicates no known  allergies.  Home Medications   Current Outpatient Rx  Name Route Sig Dispense Refill  . ACETAMINOPHEN 500 MG PO TABS Oral Take 1,000 mg by mouth 2 (two) times daily.     Marland Kitchen AMLODIPINE BESYLATE 10 MG PO TABS Oral Take 10 mg by mouth daily.      . ASPIRIN 81 MG PO TABS Oral Take 81 mg by mouth. Take 2 tabs daily    . DIALYVITE PO Oral Take 1 tablet by mouth daily.      Marland Kitchen CALCIUM  ACETATE 667 MG PO CAPS Oral Take 1,334 mg by mouth 3 (three) times daily with meals.      Marland Kitchen CALCIUM CARBONATE ANTACID 1000 MG PO CHEW Oral Chew 1,000 mg by mouth daily as needed. For heartburn     . CINACALCET HCL 30 MG PO TABS Oral Take 30 mg by mouth daily.      Marland Kitchen CLOPIDOGREL BISULFATE 75 MG PO TABS Oral Take 75 mg by mouth daily.      Marland Kitchen FAMOTIDINE 20 MG PO TABS Oral Take 20 mg by mouth daily.      . GUAIFENESIN ER 600 MG PO TB12 Oral Take 600 mg by mouth 2 (two) times daily as needed. For cough & congestion     . HYDROCODONE-ACETAMINOPHEN 5-500 MG PO TABS Oral Take 1-2 tablets by mouth every 6 (six) hours as needed for pain. 15 tablet 0  . LABETALOL HCL 300 MG PO TABS Oral Take 300 mg by mouth 2 (two) times daily.      Marland Kitchen LEVOTHYROXINE SODIUM 88 MCG PO CAPS Oral Take 1 capsule by mouth daily.      Marland Kitchen METOPROLOL SUCCINATE ER 50 MG PO TB24 Oral Take 50 mg by mouth daily.      Marland Kitchen ONE-DAILY MULTI VITAMINS PO TABS Oral Take 1 tablet by mouth daily.      Marland Kitchen NAPROXEN SODIUM 220 MG PO TABS Oral Take 220 mg by mouth 2 (two) times daily.      Marland Kitchen NIACIN ER (ANTIHYPERLIPIDEMIC) 500 MG PO TBCR Oral Take 500 mg by mouth daily.      Marland Kitchen NITROGLYCERIN 0.4 MG/SPRAY TL SOLN Sublingual Place 1 spray under the tongue every 5 (five) minutes as needed. For chest pain     . TRAVOPROST 0.004 % OP SOLN Both Eyes Place 1 drop into both eyes every morning.       BP 134/63  Pulse 72  Temp 99.2 F (37.3 C)  Resp 22  SpO2 96%  Physical Exam  Nursing note and vitals reviewed. Constitutional: He is oriented to person, place, and time. He appears well-developed and well-nourished.  Non-toxic appearance. He does not have a sickly appearance.  HENT:  Head: Normocephalic and atraumatic.  Eyes: Conjunctivae, EOM and lids are normal. Pupils are equal, round, and reactive to light.  Neck: Trachea normal, normal range of motion and full passive range of motion without pain. Neck supple.  Cardiovascular: Normal rate, regular  rhythm and normal heart sounds.   Pulmonary/Chest: Effort normal and breath sounds normal. No respiratory distress.  Abdominal: Soft. Normal appearance. He exhibits no distension. There is no tenderness. There is no rebound and no CVA tenderness.  Musculoskeletal: Normal range of motion.       Patient has multiple witnessed episodes of diffuse facial tremors specifically with less noticeable shaking of the patient's left leg.  Patient is able to talk to me throughout these episodes.  The episodes last only a few seconds at a  time.  Neurological: He is alert and oriented to person, place, and time. He has normal strength.  Skin: Skin is warm, dry and intact. No rash noted.  Psychiatric: He has a normal mood and affect. His behavior is normal. Judgment and thought content normal.    ED Course  Procedures (including critical care time)  Results for orders placed during the hospital encounter of 09/22/11  GLUCOSE, CAPILLARY      Component Value Range   Glucose-Capillary 106 (*) 70 - 99 (mg/dL)  POCT I-STAT, CHEM 8      Component Value Range   Sodium 142  135 - 145 (mEq/L)   Potassium 3.6  3.5 - 5.1 (mEq/L)   Chloride 98  96 - 112 (mEq/L)   BUN 21  6 - 23 (mg/dL)   Creatinine, Ser 1.61 (*) 0.50 - 1.35 (mg/dL)   Glucose, Bld 096 (*) 70 - 99 (mg/dL)   Calcium, Ion 0.45 (*) 1.12 - 1.32 (mmol/L)   TCO2 34  0 - 100 (mmol/L)   Hemoglobin 11.9 (*) 13.0 - 17.0 (g/dL)   HCT 40.9 (*) 81.1 - 52.0 (%)        MDM  Patient presents with his initial set of tremors.  After one dose of Ativan they had completely resolved.  Patient shows normal electrolytes here.  His creatinine is elevated but this is expected given that this is a dialysis patient.  At this point time I feel the patient is safe for discharge home and I will send him home with a few Ativan tablets to be used as needed and have instructed him to return for worsening symptoms.  This does not appear to be consistent with seizures as the  patient is able to talk to [place] the whole way through.  It does not appear to be related to any infection or electrolyte abnormalities at this time.        Nat Christen, MD 09/22/11 [phone]

## 2011-09-22 NOTE — Discharge Instructions (Signed)
You have you have tremors of unknown origin today.  Your electrolytes were checked and they were normal.  This does not appear consistent with a seizure.  I do recommend followup with your primary care physician in the next week for reevaluation.  Please return for further worsening of your tremors or other new symptoms.  Use the Ativan tablets if needed if the tremors recur.

## 2011-09-22 NOTE — ED Notes (Signed)
Pt received Tues, Thurs, Sat dialysis treatment today without incident today. Went to catch the bus home and developed generalized facial, hand and leg tremors which worsened during bus ride. Patient was unable to ambulate off of bus due to tremors and weakness. BBG 94. Pt reports prev episode of same 6 mo ago which self resolved.

## 2011-09-26 ENCOUNTER — Encounter (HOSPITAL_COMMUNITY): Payer: Self-pay | Admitting: Emergency Medicine

## 2011-09-26 ENCOUNTER — Emergency Department (HOSPITAL_COMMUNITY)
Admission: EM | Admit: 2011-09-26 | Discharge: 2011-09-26 | Disposition: A | Discharge status: Home / Self Care | Payer: Medicare Other | Attending: Emergency Medicine | Admitting: Emergency Medicine

## 2011-09-26 DIAGNOSIS — N186 End stage renal disease: Secondary | ICD-10-CM | POA: Insufficient documentation

## 2011-09-26 DIAGNOSIS — E039 Hypothyroidism, unspecified: Secondary | ICD-10-CM | POA: Insufficient documentation

## 2011-09-26 DIAGNOSIS — Z8639 Personal history of other endocrine, nutritional and metabolic disease: Secondary | ICD-10-CM | POA: Insufficient documentation

## 2011-09-26 DIAGNOSIS — M1990 Unspecified osteoarthritis, unspecified site: Secondary | ICD-10-CM | POA: Insufficient documentation

## 2011-09-26 DIAGNOSIS — Z862 Personal history of diseases of the blood and blood-forming organs and certain disorders involving the immune mechanism: Secondary | ICD-10-CM | POA: Insufficient documentation

## 2011-09-26 DIAGNOSIS — I120 Hypertensive chronic kidney disease with stage 5 chronic kidney disease or end stage renal disease: Secondary | ICD-10-CM | POA: Insufficient documentation

## 2011-09-26 DIAGNOSIS — Z79899 Other long term (current) drug therapy: Secondary | ICD-10-CM | POA: Insufficient documentation

## 2011-09-26 DIAGNOSIS — I252 Old myocardial infarction: Secondary | ICD-10-CM | POA: Insufficient documentation

## 2011-09-26 DIAGNOSIS — I2510 Atherosclerotic heart disease of native coronary artery without angina pectoris: Secondary | ICD-10-CM | POA: Insufficient documentation

## 2011-09-26 LAB — BASIC METABOLIC PANEL
CO2: 29 mEq/L (ref 19–32)
Chloride: 96 mEq/L (ref 96–112)
GFR calc Af Amer: 5 mL/min — ABNORMAL LOW (ref >90)
Potassium: 5.1 mEq/L (ref 3.5–5.1)
Sodium: 141 mEq/L (ref 135–145)

## 2011-09-26 NOTE — ED Notes (Signed)
Pt in for examination r/t missing HD apt today, pt hx of HD x 5 yrs, pt receives HD on T, Th, & Sat., pt states, "The bus that usually takes me to my HD apt did not come today & I just want to get checked out to make sure everything is ok since I missed today's apt." pt completed T & Th HD apts this wk, pt denies CP, SOB, N/V/D, NAD

## 2011-09-26 NOTE — ED Provider Notes (Signed)
History     CSN: [id number]  Arrival date & time 09/26/11  1236   First MD Initiated Contact with Patient 09/26/11 1251      Chief Complaint  Patient presents with  . Vascular Access Problem    Pt due for dialysis today    (Consider location/radiation/quality/duration/timing/severity/associated sxs/prior treatment) The history is provided by the patient.   patient dialyzes on Tuesdays Thursdays and Saturdays.  He was scheduled for dialysis today but reports the transport Zena[facility] Niece never came to pick him up.  He is without chest pain shortness of breath.  He came to the ER because he want to make sure he didn't have too much fluid on him.  He reports he feels fine.  He has no complaints.  He has no symptoms  Past Medical History  Diagnosis Date  . Psychosexual dysfunction with inhibited sexual excitement   . Other specified personal history presenting hazards to health   . Generalized osteoarthrosis, unspecified site   . Osteoarthrosis, unspecified whether generalized or localized, unspecified site   . 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 Unspecified hypothyroidism   . Gout   . CAD (coronary artery disease)   . Unspecified deficiency anemia   . Unspecified essential hypertension   . End stage renal disease   . Myocardial infarction 1964  . Back pain     Past Surgical History  Procedure Date  . Cabg     1994  . Av fistula placement   . Coronary artery bypass graft     Family History  Problem Relation Age of Onset  . Heart disease Mother   . Heart disease Father   . Heart disease Sister     History  Substance Use Topics  . Smoking status: Former Smoker -- 15 years    Types: Cigarettes    Quit date: 04/05/1961  . Smokeless tobacco: Never Used  . Alcohol Use: No      Review of Systems  All other systems reviewed and are negative.    Allergies  Review of patient's allergies indicates no known allergies.  Home  Medications   Current Outpatient Rx  Name Route Sig Dispense Refill  . ACETAMINOPHEN 500 MG PO TABS Oral Take 1,000 mg by mouth 2 (two) times daily.     . ASPIRIN 81 MG PO TABS Oral Take 81 mg by mouth. Take 2 tabs daily    . NAPROXEN SODIUM 220 MG PO TABS Oral Take 220 mg by mouth 2 (two) times daily.      Marland Kitchen NIACIN ER (ANTIHYPERLIPIDEMIC) 500 MG PO TBCR Oral Take 500 mg by mouth daily.      . TRAVOPROST 0.004 % OP SOLN Both Eyes Place 1 drop into both eyes every morning.     Marland Kitchen AMLODIPINE BESYLATE 10 MG PO TABS Oral Take 10 mg by mouth daily.      Marland Kitchen DIALYVITE PO Oral Take 1 tablet by mouth daily.      Marland Kitchen CALCIUM ACETATE 667 MG PO CAPS Oral Take 1,334 mg by mouth 3 (three) times daily with meals.      Marland Kitchen CALCIUM CARBONATE ANTACID 1000 MG PO CHEW Oral Chew 1,000 mg by mouth daily as needed. For heartburn     . CINACALCET HCL 30 MG PO TABS Oral Take 30 mg by mouth daily.      Marland Kitchen CLOPIDOGREL BISULFATE 75 MG PO TABS Oral Take 75 mg by mouth daily.      Marland Kitchen  FAMOTIDINE 20 MG PO TABS Oral Take 20 mg by mouth daily.      . GUAIFENESIN ER 600 MG PO TB12 Oral Take 600 mg by mouth 2 (two) times daily as needed. For cough & congestion     . HYDROCODONE-ACETAMINOPHEN 5-500 MG PO TABS Oral Take 1-2 tablets by mouth every 6 (six) hours as needed for pain. 15 tablet 0  . LABETALOL HCL 300 MG PO TABS Oral Take 300 mg by mouth 2 (two) times daily.      Marland Kitchen LEVOTHYROXINE SODIUM 88 MCG PO CAPS Oral Take 1 capsule by mouth daily.      Marland Kitchen LORAZEPAM 1 MG PO TABS Oral Take 1 tablet (1 mg total) by mouth every 8 (eight) hours as needed (for tremors). 5 tablet 0  . METOPROLOL SUCCINATE ER 50 MG PO TB24 Oral Take 50 mg by mouth daily.      Marland Kitchen ONE-DAILY MULTI VITAMINS PO TABS Oral Take 1 tablet by mouth daily.      Marland Kitchen NITROGLYCERIN 0.4 MG/SPRAY TL SOLN Sublingual Place 1 spray under the tongue every 5 (five) minutes as needed. For chest pain       BP 175/101  Pulse 71  Temp(Src) 97.4 F (36.3 C) (Oral)  Resp 18  SpO2  99%  Physical Exam  Nursing note and vitals reviewed. Constitutional: He is oriented to person, place, and time. He appears well-developed and well-nourished.  HENT:  Head: Normocephalic and atraumatic.  Eyes: EOM are normal.  Neck: Normal range of motion.  Cardiovascular: Normal rate, regular rhythm, normal heart sounds and intact distal pulses.   Pulmonary/Chest: Effort normal and breath sounds normal. No respiratory distress.  Abdominal: Soft. He exhibits no distension. There is no tenderness.  Musculoskeletal: Normal range of motion.  Neurological: He is alert and oriented to person, place, and time.  Skin: Skin is warm and dry.  Psychiatric: He has a normal mood and affect. Judgment normal.    ED Course  Procedures (including critical care time)  Labs Reviewed  BASIC METABOLIC PANEL - Abnormal; Notable for the following:    BUN 47 (*)    Creatinine, Ser 10.70 (*)    GFR calc non Af Amer 4 (*)    GFR calc Af Amer 5 (*)    All other components within normal limits   No results found.   1. End stage renal disease       MDM  The patient is without complaints.  His oxygen saturation is normal.  Has no orthopnea or PND.  He has no exertional shortness of breath.  His lungs are clear.  I doubly the patient to be volume overloaded.  I will obtain his potassium to evaluate.  The patient will likely be a be discharged home with dialysis on Monday.  At this time is not evident that he requires emergent dialysis or even urgent for that matter        Lyanne Co, MD 09/26/11 [phone]

## 2011-09-26 NOTE — ED Notes (Signed)
Pt reports transportation never picked him up for dialysis yesterday. Pt goes to dialysis center in [place].

## 2011-09-28 NOTE — Procedures (Unsigned)
VASCULAR LAB EXAM                          ***  CORRECTED  COPY  ***   INDICATION:  Nonmaturing AV fistula  HISTORY: Diabetes: Cardiac: Hypertension:  EXAM:  Right arm AV fistula duplex  IMPRESSION: 1. Patent right brachiocephalic arteriovenous fistula with maximum     velocity of 556 cm/s noted at the antecubital fossa level outflow     vein.  This velocity appears to be a result of a minimally     occlusive web like structure noted as also observed on the previous     exam on 07/27/2011. 2. Possible valve leaflets are noted in the outflow vein as described     on the attached worksheet with corresponding velocities. 3. The right vertebral  radial artery flow appears antegrade. 4. Depth, diameter and velocity measurements are noted on separate     worksheet.  ___________________________________________ Fransisco Hertz, MD  [facility]/MEDQ  D:  09/18/2011  T:  09/18/2011  Job:  [id number]

## 2011-09-29 ENCOUNTER — Inpatient Hospital Stay (HOSPITAL_COMMUNITY)
Admission: EM | Admit: 2011-09-29 | Discharge: 2011-10-14 | DRG: 884 | Disposition: A | Discharge status: Home Health | Payer: Medicare Other | Attending: Internal Medicine | Admitting: Internal Medicine

## 2011-09-29 ENCOUNTER — Encounter (HOSPITAL_COMMUNITY): Payer: Self-pay

## 2011-09-29 ENCOUNTER — Emergency Department (HOSPITAL_COMMUNITY): Payer: Medicare Other

## 2011-09-29 DIAGNOSIS — I252 Old myocardial infarction: Secondary | ICD-10-CM

## 2011-09-29 DIAGNOSIS — F0391 Unspecified dementia with behavioral disturbance: Principal | ICD-10-CM | POA: Diagnosis present

## 2011-09-29 DIAGNOSIS — Z87891 Personal history of nicotine dependence: Secondary | ICD-10-CM

## 2011-09-29 DIAGNOSIS — I120 Hypertensive chronic kidney disease with stage 5 chronic kidney disease or end stage renal disease: Secondary | ICD-10-CM | POA: Diagnosis present

## 2011-09-29 DIAGNOSIS — R0989 Other specified symptoms and signs involving the circulatory and respiratory systems: Secondary | ICD-10-CM

## 2011-09-29 DIAGNOSIS — R509 Fever, unspecified: Secondary | ICD-10-CM | POA: Diagnosis not present

## 2011-09-29 DIAGNOSIS — Z992 Dependence on renal dialysis: Secondary | ICD-10-CM | POA: Diagnosis present

## 2011-09-29 DIAGNOSIS — R251 Tremor, unspecified: Secondary | ICD-10-CM

## 2011-09-29 DIAGNOSIS — E039 Hypothyroidism, unspecified: Secondary | ICD-10-CM

## 2011-09-29 DIAGNOSIS — F03918 Unspecified dementia, unspecified severity, with other behavioral disturbance: Principal | ICD-10-CM | POA: Diagnosis present

## 2011-09-29 DIAGNOSIS — Z7982 Long term (current) use of aspirin: Secondary | ICD-10-CM

## 2011-09-29 DIAGNOSIS — D539 Nutritional anemia, unspecified: Secondary | ICD-10-CM

## 2011-09-29 DIAGNOSIS — F528 Other sexual dysfunction not due to a substance or known physiological condition: Secondary | ICD-10-CM

## 2011-09-29 DIAGNOSIS — M899 Disorder of bone, unspecified: Secondary | ICD-10-CM | POA: Diagnosis present

## 2011-09-29 DIAGNOSIS — M159 Polyosteoarthritis, unspecified: Secondary | ICD-10-CM

## 2011-09-29 DIAGNOSIS — I2510 Atherosclerotic heart disease of native coronary artery without angina pectoris: Secondary | ICD-10-CM

## 2011-09-29 DIAGNOSIS — E119 Type 2 diabetes mellitus without complications: Secondary | ICD-10-CM | POA: Diagnosis present

## 2011-09-29 DIAGNOSIS — N2581 Secondary hyperparathyroidism of renal origin: Secondary | ICD-10-CM | POA: Diagnosis present

## 2011-09-29 DIAGNOSIS — D638 Anemia in other chronic diseases classified elsewhere: Secondary | ICD-10-CM | POA: Diagnosis present

## 2011-09-29 DIAGNOSIS — R4182 Altered mental status, unspecified: Secondary | ICD-10-CM | POA: Diagnosis present

## 2011-09-29 DIAGNOSIS — Z951 Presence of aortocoronary bypass graft: Secondary | ICD-10-CM

## 2011-09-29 DIAGNOSIS — M1990 Unspecified osteoarthritis, unspecified site: Secondary | ICD-10-CM

## 2011-09-29 DIAGNOSIS — R0609 Other forms of dyspnea: Secondary | ICD-10-CM

## 2011-09-29 DIAGNOSIS — I10 Essential (primary) hypertension: Secondary | ICD-10-CM

## 2011-09-29 DIAGNOSIS — Z862 Personal history of diseases of the blood and blood-forming organs and certain disorders involving the immune mechanism: Secondary | ICD-10-CM

## 2011-09-29 DIAGNOSIS — N186 End stage renal disease: Secondary | ICD-10-CM

## 2011-09-29 DIAGNOSIS — Z8639 Personal history of other endocrine, nutritional and metabolic disease: Secondary | ICD-10-CM

## 2011-09-29 DIAGNOSIS — M949 Disorder of cartilage, unspecified: Secondary | ICD-10-CM | POA: Diagnosis present

## 2011-09-29 DIAGNOSIS — Z9189 Other specified personal risk factors, not elsewhere classified: Secondary | ICD-10-CM

## 2011-09-29 DIAGNOSIS — Z794 Long term (current) use of insulin: Secondary | ICD-10-CM

## 2011-09-29 DIAGNOSIS — Z7902 Long term (current) use of antithrombotics/antiplatelets: Secondary | ICD-10-CM

## 2011-09-29 DIAGNOSIS — E875 Hyperkalemia: Secondary | ICD-10-CM

## 2011-09-29 DIAGNOSIS — W19XXXA Unspecified fall, initial encounter: Secondary | ICD-10-CM | POA: Diagnosis present

## 2011-09-29 DIAGNOSIS — Z781 Physical restraint status: Secondary | ICD-10-CM | POA: Diagnosis present

## 2011-09-29 DIAGNOSIS — N529 Male erectile dysfunction, unspecified: Secondary | ICD-10-CM | POA: Diagnosis present

## 2011-09-29 LAB — CBC
HCT: 29.4 % — ABNORMAL LOW (ref 39.0–52.0)
MCH: 33.3 pg (ref 26.0–34.0)
MCV: 100 fL (ref 78.0–100.0)
Platelets: 218 10*3/uL (ref 150–400)
RBC: 2.94 MIL/uL — ABNORMAL LOW (ref 4.22–5.81)
RDW: 14.3 % (ref 11.5–15.5)
WBC: 9.8 10*3/uL (ref 4.0–10.5)

## 2011-09-29 LAB — ETHANOL: Alcohol, Ethyl (B): <11 mg/dL (ref 0–11)

## 2011-09-29 LAB — URINALYSIS, ROUTINE W REFLEX MICROSCOPIC
Glucose, UA: NEGATIVE mg/dL
Ketones, ur: NEGATIVE mg/dL
Leukocytes, UA: NEGATIVE
Nitrite: NEGATIVE
pH: >9 — ABNORMAL HIGH (ref 5.0–8.0)

## 2011-09-29 LAB — COMPREHENSIVE METABOLIC PANEL
CO2: 25 mEq/L (ref 19–32)
Calcium: 9.3 mg/dL (ref 8.4–10.5)
Creatinine, Ser: 18.41 mg/dL — ABNORMAL HIGH (ref 0.50–1.35)
GFR calc Af Amer: 2 mL/min — ABNORMAL LOW (ref >90)
GFR calc non Af Amer: 2 mL/min — ABNORMAL LOW (ref >90)
Glucose, Bld: 78 mg/dL (ref 70–99)
Total Protein: 7.7 g/dL (ref 6.0–8.3)

## 2011-09-29 LAB — URINE MICROSCOPIC-ADD ON

## 2011-09-29 LAB — DIFFERENTIAL
Eosinophils Absolute: 0.2 10*3/uL (ref 0.0–0.7)
Eosinophils Relative: 2 % (ref 0–5)
Lymphocytes Relative: 13 % (ref 12–46)
Lymphs Abs: 1.2 10*3/uL (ref 0.7–4.0)
Monocytes Absolute: 1.4 10*3/uL — ABNORMAL HIGH (ref 0.1–1.0)

## 2011-09-29 LAB — AMMONIA: Ammonia: 18 umol/L (ref 11–60)

## 2011-09-29 MED ORDER — SODIUM POLYSTYRENE SULFONATE 15 GM/60ML PO SUSP
45.0000 g | Freq: Once | ORAL | Status: AC
  Administered 2011-09-29: 45 g via ORAL
  Filled 2011-09-29: qty 180

## 2011-09-29 MED ORDER — LORAZEPAM 2 MG/ML IJ SOLN
1.0000 mg | Freq: Once | INTRAMUSCULAR | Status: AC
  Administered 2011-09-29: 1 mg via INTRAVENOUS
  Filled 2011-09-29: qty 1

## 2011-09-29 MED ORDER — SODIUM CHLORIDE 0.9 % IV SOLN
INTRAVENOUS | Status: DC
  Administered 2011-09-29: 17:00:00 via INTRAVENOUS

## 2011-09-29 NOTE — ED Notes (Signed)
In and catheterization done but very scanty urine output noted

## 2011-09-29 NOTE — ED Provider Notes (Signed)
History     CSN: [id number]  Arrival date & time 09/29/11  1652   First MD Initiated Contact with Patient 09/29/11 1658      Chief Complaint  Patient presents with  . Altered Mental Status    (Consider location/radiation/quality/duration/timing/severity/associated sxs/prior treatment) HPI Level V Caveat-CONFUSION Jared [name] is a 78 y.o. malepresents with c/o altered mental status leading to desire to be assessed in the ED. The sx(s) have been present for unknown time. Additional concerns are he is twitching. Causative factors are not known. Palliative factors are nothing has been tried. The distress associated is moderate. The disorder has been present for unknown time. He lives in a group home. The parents and they're called EMS for transport here. The patient was in the emergency department on 09/25/2010. At that time he missed dialysis, was screened and discharged. He reportedly missed dialysis yesterday.   Past Medical History  Diagnosis Date  . Psychosexual dysfunction with inhibited sexual excitement   . Other specified personal history presenting hazards to health   . Generalized osteoarthrosis, unspecified site   . Osteoarthrosis, unspecified whether generalized or localized, unspecified site   . 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 Unspecified hypothyroidism   . Gout   . CAD (coronary artery disease)   . Unspecified deficiency anemia   . Unspecified essential hypertension   . End stage renal disease   . Myocardial infarction 1964  . Back pain     Past Surgical History  Procedure Date  . Cabg     1994  . Av fistula placement   . Coronary artery bypass graft     Family History  Problem Relation Age of Onset  . Heart disease Mother   . Heart disease Father   . Heart disease Sister     History  Substance Use Topics  . Smoking status: Former Smoker -- 15 years    Types: Cigarettes    Quit date: 04/05/1961  .  Smokeless tobacco: Never Used  . Alcohol Use: No      Review of Systems  All other systems reviewed and are negative.    Allergies  Review of patient's allergies indicates no known allergies.  Home Medications   Current Outpatient Rx  Name Route Sig Dispense Refill  . ACETAMINOPHEN 500 MG PO TABS Oral Take 1,000 mg by mouth 2 (two) times daily.     Marland Kitchen AMLODIPINE BESYLATE 10 MG PO TABS Oral Take 10 mg by mouth daily.    . ASPIRIN 81 MG PO TABS Oral Take 81 mg by mouth. Take 2 tabs daily    . DIALYVITE PO Oral Take 1 tablet by mouth daily.      Marland Kitchen CALCIUM ACETATE 667 MG PO CAPS Oral Take 1,334 mg by mouth 3 (three) times daily with meals.      Marland Kitchen CALCIUM CARBONATE ANTACID 1000 MG PO CHEW Oral Chew 1,000 mg by mouth daily as needed. For heartburn     . CINACALCET HCL 30 MG PO TABS Oral Take 30 mg by mouth daily.      Marland Kitchen CLOPIDOGREL BISULFATE 75 MG PO TABS Oral Take 75 mg by mouth daily.      Marland Kitchen FAMOTIDINE 20 MG PO TABS Oral Take 20 mg by mouth daily.      . GUAIFENESIN ER 600 MG PO TB12 Oral Take 600 mg by mouth 2 (two) times daily as needed. For cough & congestion     .  LABETALOL HCL 300 MG PO TABS Oral Take 300 mg by mouth 2 (two) times daily.      Marland Kitchen LEVOTHYROXINE SODIUM 88 MCG PO CAPS Oral Take 1 capsule by mouth daily.      Marland Kitchen LORAZEPAM 1 MG PO TABS Oral Take 1 tablet (1 mg total) by mouth every 8 (eight) hours as needed (for tremors). 5 tablet 0  . METOPROLOL SUCCINATE ER 50 MG PO TB24 Oral Take 50 mg by mouth daily.      Marland Kitchen ONE-DAILY MULTI VITAMINS PO TABS Oral Take 1 tablet by mouth daily.      Marland Kitchen NAPROXEN SODIUM 220 MG PO TABS Oral Take 220 mg by mouth 2 (two) times daily.      Marland Kitchen NIACIN ER (ANTIHYPERLIPIDEMIC) 500 MG PO TBCR Oral Take 500 mg by mouth daily.      Marland Kitchen NITROGLYCERIN 0.4 MG/SPRAY TL SOLN Sublingual Place 1 spray under the tongue every 5 (five) minutes as needed. For chest pain     . TRAVOPROST 0.004 % OP SOLN Both Eyes Place 1 drop into both eyes every morning.       BP  195/95  Pulse 88  Temp(Src) 98.7 F (37.1 C) (Axillary)  Resp 19  SpO2 96%  Physical Exam  Nursing note and vitals reviewed. Constitutional: He appears well-developed and well-nourished.  HENT:  Head: Normocephalic and atraumatic.  Right Ear: External ear normal.  Left Ear: External ear normal.  Eyes: Conjunctivae and EOM are normal. Pupils are equal, round, and reactive to light.  Neck: Normal range of motion and phonation normal. Neck supple.  Cardiovascular: Normal rate, regular rhythm, normal heart sounds and intact distal pulses.   Pulmonary/Chest: Effort normal and breath sounds normal. He exhibits no bony tenderness.  Abdominal: Soft. Normal appearance. There is no tenderness.  Musculoskeletal: Normal range of motion.       Fistula left upper arm, with good thrill.  Neurological: He is alert. He has normal strength. No cranial nerve deficit or sensory deficit. He exhibits normal muscle tone. Coordination normal.       Irregular facial twitching, bilateral. Does not follow commands.   Skin: Skin is warm, dry and intact.  Psychiatric: He has a normal mood and affect. His behavior is normal. Judgment and thought content normal.    ED Course  Procedures (including critical care time)  Emergency department treatment: Ativan IV. Patient became alert and stop twitching after the event. He was treated with Kayexalate for hyperkalemia. He was incontinent of stool afterwards.      Labs Reviewed  URINALYSIS, ROUTINE W REFLEX MICROSCOPIC - Abnormal; Notable for the following:    pH >9.0 (*)    Hgb urine dipstick LARGE (*)    Protein, ur >300 (*)    All other components within normal limits  CBC - Abnormal; Notable for the following:    RBC 2.94 (*)    Hemoglobin 9.8 (*)    HCT 29.4 (*)    All other components within normal limits  DIFFERENTIAL - Abnormal; Notable for the following:    Monocytes Relative 14 (*)    Monocytes Absolute 1.4 (*)    All other components within  normal limits  COMPREHENSIVE METABOLIC PANEL - Abnormal; Notable for the following:    Potassium 6.6 (*)    Chloride 94 (*)    BUN 91 (*)    Creatinine, Ser 18.41 (*)    GFR calc non Af Amer 2 (*)    GFR calc Af Denyse Dago  2 (*)    All other components within normal limits  URINE MICROSCOPIC-ADD ON - Abnormal; Notable for the following:    Squamous Epithelial / LPF FEW (*)    All other components within normal limits  AMMONIA  ETHANOL  URINE CULTURE  URINE RAPID DRUG SCREEN (HOSP PERFORMED)   Ct Head Wo Contrast  09/29/2011  *RADIOLOGY REPORT*  Clinical Data: 78 year old male with altered mental status. Dialysis.  Involuntary movements.  CT HEAD WITHOUT CONTRAST  Technique:  Contiguous axial images were obtained from the base of the skull through the vertex without contrast.  Comparison: 09/10/2010 and earlier.  Findings: Degenerative changes at C1-C2. Visualized paranasal sinuses and mastoids are clear.  No acute osseous abnormality identified.  Visualized orbit soft tissues are within normal limits.  Calcified atherosclerosis.  Stable scalp soft tissues.  Stable cerebral volume.  Stable ventriculomegaly.  Confluent white matter hypodensity has not significantly changed.  Chronic right thalamic lacunar infarct appears stable. No midline shift, mass effect, or evidence of mass lesion.  No acute intracranial hemorrhage identified.  No evidence of cortically based acute infarction identified.  IMPRESSION: Chronic small vessel disease and volume loss. No acute intracranial abnormality.  Original Report Authenticated By: Harley Hallmark, M.D.   Dg Chest Port 1 View  09/29/2011  *RADIOLOGY REPORT*  Clinical Data: Altered mental status.  PORTABLE CHEST - 1 VIEW  Comparison: 03/30/2011.  Findings: Progressive enlargement of the cardiac silhouette with a mild increase in prominence of the pulmonary vasculature.  Stable post CABG changes.  Unremarkable bones.  IMPRESSION: Mildly progressive cardiomegaly and  pulmonary vascular congestion.  Original Report Authenticated By: Darrol Angel, M.D.   Attempts were made to contact his facility, where he lives or find a family member. This could not be done. Patient will be admitted  1. Hyperkalemia   2. End stage renal disease   3. Tremor   4. Altered mental status       MDM  Facial twitching, resolved after treatment with Ativan times one dose. It is, unclear when he was last dialyzed. Patient cannot give history. Urine is abnormal and the culture has been ordered.    Patient is noted for observation. Disposition with consideration of dialysis in the morning.    Flint Melter, MD 09/30/11 [id number]

## 2011-09-29 NOTE — ED Notes (Signed)
Pt was noted to have an AV shunt in Lt upper arm and when the rt arm was checked, a thrill was felt in the in the rt upper arm with a healed surgical site in the antecubital area. Unable to access old records to check history. Called the dialysis unit and asked if they will be able to help pull old records of this patient. The dialysis nurse was about the presence of a shunt in the lt arm and that a thrill was felt in the rt upper arm, was advised by the dialysis nurse to use the rt forearm or hand for IV site.

## 2011-09-29 NOTE — ED Notes (Signed)
Called nephrology per Dr. Effie Shy

## 2011-09-29 NOTE — ED Notes (Signed)
Dr. Effie Shy is made aware of the result of patient's K, BUN and Creatinine level.

## 2011-09-29 NOTE — ED Notes (Signed)
Pt was brought in by EMS with altered mental status. Pt incoherent

## 2011-09-29 NOTE — ED Notes (Signed)
As b

## 2011-09-29 NOTE — ED Notes (Signed)
Pt was brought in by EMS with Altered Mental Status. Pt is a dialysis patient and is supposed to have dialysis done today. Pt noted to have involuntary twitching of his face and jerking of his hands. Pt unable to answer questions appropriately.

## 2011-09-30 ENCOUNTER — Encounter (HOSPITAL_COMMUNITY): Payer: Self-pay | Admitting: Internal Medicine

## 2011-09-30 ENCOUNTER — Observation Stay (HOSPITAL_COMMUNITY): Payer: Medicare Other

## 2011-09-30 DIAGNOSIS — R4182 Altered mental status, unspecified: Secondary | ICD-10-CM | POA: Diagnosis present

## 2011-09-30 DIAGNOSIS — N186 End stage renal disease: Secondary | ICD-10-CM | POA: Diagnosis present

## 2011-09-30 DIAGNOSIS — E875 Hyperkalemia: Secondary | ICD-10-CM | POA: Diagnosis present

## 2011-09-30 LAB — CBC
HCT: 26 % — ABNORMAL LOW (ref 39.0–52.0)
Hemoglobin: 8.5 g/dL — ABNORMAL LOW (ref 13.0–17.0)
MCH: 32.6 pg (ref 26.0–34.0)
MCHC: 32.7 g/dL (ref 30.0–36.0)
MCV: 99.6 fL (ref 78.0–100.0)
Platelets: 198 K/uL (ref 150–400)
RBC: 2.61 MIL/uL — ABNORMAL LOW (ref 4.22–5.81)
RDW: 14.3 % (ref 11.5–15.5)
WBC: 8 K/uL (ref 4.0–10.5)

## 2011-09-30 LAB — COMPREHENSIVE METABOLIC PANEL WITH GFR
ALT: 14 U/L (ref 0–53)
AST: 15 U/L (ref 0–37)
Albumin: 3.2 g/dL — ABNORMAL LOW (ref 3.5–5.2)
Alkaline Phosphatase: 75 U/L (ref 39–117)
BUN: 98 mg/dL — ABNORMAL HIGH (ref 6–23)
CO2: 24 meq/L (ref 19–32)
Calcium: 8.8 mg/dL (ref 8.4–10.5)
Chloride: 95 meq/L — ABNORMAL LOW (ref 96–112)
Creatinine, Ser: 19.15 mg/dL — ABNORMAL HIGH (ref 0.50–1.35)
GFR calc Af Amer: 2 mL/min — ABNORMAL LOW
GFR calc non Af Amer: 2 mL/min — ABNORMAL LOW
Glucose, Bld: 71 mg/dL (ref 70–99)
Potassium: 5.3 meq/L — ABNORMAL HIGH (ref 3.5–5.1)
Sodium: 141 meq/L (ref 135–145)
Total Bilirubin: 0.4 mg/dL (ref 0.3–1.2)
Total Protein: 6.9 g/dL (ref 6.0–8.3)

## 2011-09-30 LAB — URINE CULTURE
Colony Count: NO GROWTH
Culture  Setup Time: 201302191932

## 2011-09-30 LAB — MRSA PCR SCREENING: MRSA by PCR: POSITIVE — AB

## 2011-09-30 MED ORDER — ADULT MULTIVITAMIN W/MINERALS CH
1.0000 | ORAL_TABLET | Freq: Every day | ORAL | Status: DC
  Filled 2011-09-30: qty 1

## 2011-09-30 MED ORDER — FAMOTIDINE 20 MG PO TABS
20.0000 mg | ORAL_TABLET | Freq: Every day | ORAL | Status: DC
  Administered 2011-09-30 – 2011-10-12 (×4): 20 mg via ORAL
  Filled 2011-09-30 (×15): qty 1

## 2011-09-30 MED ORDER — ASPIRIN 81 MG PO CHEW
81.0000 mg | CHEWABLE_TABLET | Freq: Every day | ORAL | Status: DC
  Administered 2011-10-08 – 2011-10-12 (×3): 81 mg via ORAL
  Filled 2011-09-30 (×5): qty 1

## 2011-09-30 MED ORDER — ACETAMINOPHEN 650 MG RE SUPP: 650.0000 mg | Freq: Four times a day (QID) | RECTAL | Status: DC | PRN

## 2011-09-30 MED ORDER — CALCIUM ACETATE 667 MG PO CAPS
1334.0000 mg | ORAL_CAPSULE | Freq: Three times a day (TID) | ORAL | Status: DC
  Administered 2011-09-30 – 2011-10-08 (×5): 1334 mg via ORAL
  Filled 2011-09-30 (×34): qty 2

## 2011-09-30 MED ORDER — ONDANSETRON HCL 4 MG PO TABS: 4.0000 mg | ORAL_TABLET | Freq: Four times a day (QID) | ORAL | Status: DC | PRN

## 2011-09-30 MED ORDER — DARBEPOETIN ALFA-POLYSORBATE 200 MCG/0.4ML IJ SOLN
200.0000 ug | INTRAMUSCULAR | Status: DC
  Filled 2011-09-30 (×3): qty 0.4

## 2011-09-30 MED ORDER — SODIUM CHLORIDE 0.9 % IJ SOLN
3.0000 mL | Freq: Two times a day (BID) | INTRAMUSCULAR | Status: DC
  Administered 2011-09-30 – 2011-10-06 (×6): 3 mL via INTRAVENOUS

## 2011-09-30 MED ORDER — PARICALCITOL 5 MCG/ML IV SOLN
INTRAVENOUS | Status: AC
  Administered 2011-09-30: 8 ug via INTRAVENOUS
  Filled 2011-09-30: qty 2

## 2011-09-30 MED ORDER — HEPARIN SODIUM (PORCINE) 1000 UNIT/ML DIALYSIS
20.0000 [IU]/kg | INTRAMUSCULAR | Status: DC | PRN
  Filled 2011-09-30: qty 2

## 2011-09-30 MED ORDER — SODIUM CHLORIDE 0.9 % IJ SOLN: 3.0000 mL | Freq: Two times a day (BID) | INTRAMUSCULAR | Status: DC

## 2011-09-30 MED ORDER — LEVOTHYROXINE SODIUM 88 MCG PO TABS
88.0000 ug | ORAL_TABLET | Freq: Every day | ORAL | Status: DC
  Administered 2011-09-30 – 2011-10-05 (×3): 88 ug via ORAL
  Filled 2011-09-30 (×16): qty 1

## 2011-09-30 MED ORDER — ACETAMINOPHEN 325 MG PO TABS: 650.0000 mg | ORAL_TABLET | Freq: Four times a day (QID) | ORAL | Status: DC | PRN

## 2011-09-30 MED ORDER — ONDANSETRON HCL 4 MG/2ML IJ SOLN: 4.0000 mg | Freq: Three times a day (TID) | INTRAMUSCULAR | Status: DC | PRN

## 2011-09-30 MED ORDER — NITROGLYCERIN 0.4 MG/SPRAY TL SOLN: 1.0000 | Status: DC | PRN

## 2011-09-30 MED ORDER — METOPROLOL SUCCINATE ER 50 MG PO TB24
50.0000 mg | ORAL_TABLET | Freq: Every day | ORAL | Status: DC
  Administered 2011-09-30: 50 mg via ORAL
  Filled 2011-09-30 (×3): qty 1

## 2011-09-30 MED ORDER — CINACALCET HCL 30 MG PO TABS
30.0000 mg | ORAL_TABLET | Freq: Every day | ORAL | Status: DC
  Administered 2011-09-30 – 2011-10-12 (×4): 30 mg via ORAL
  Filled 2011-09-30 (×16): qty 1

## 2011-09-30 MED ORDER — PARICALCITOL 5 MCG/ML IV SOLN
8.0000 ug | INTRAVENOUS | Status: DC
  Administered 2011-09-30: 8 ug via INTRAVENOUS
  Filled 2011-09-30 (×2): qty 1.6

## 2011-09-30 MED ORDER — LORAZEPAM 1 MG PO TABS: 1.0000 mg | ORAL_TABLET | Freq: Three times a day (TID) | ORAL | Status: DC | PRN

## 2011-09-30 MED ORDER — ONDANSETRON HCL 4 MG/2ML IJ SOLN: 4.0000 mg | Freq: Four times a day (QID) | INTRAMUSCULAR | Status: DC | PRN

## 2011-09-30 MED ORDER — CLOPIDOGREL BISULFATE 75 MG PO TABS
75.0000 mg | ORAL_TABLET | Freq: Every day | ORAL | Status: DC
  Administered 2011-09-30 – 2011-10-12 (×5): 75 mg via ORAL
  Filled 2011-09-30 (×16): qty 1

## 2011-09-30 MED ORDER — NIACIN ER 500 MG PO CPCR
500.0000 mg | ORAL_CAPSULE | Freq: Every day | ORAL | Status: DC
  Administered 2011-09-30 – 2011-10-12 (×3): 500 mg via ORAL
  Filled 2011-09-30 (×15): qty 1

## 2011-09-30 MED ORDER — NAPROXEN 250 MG PO TABS
220.0000 mg | ORAL_TABLET | Freq: Two times a day (BID) | ORAL | Status: DC
  Administered 2011-09-30 (×2): 250 mg via ORAL
  Filled 2011-09-30 (×8): qty 1

## 2011-09-30 MED ORDER — TRAVOPROST (BAK FREE) 0.004 % OP SOLN
1.0000 [drp] | OPHTHALMIC | Status: DC
  Administered 2011-09-30 – 2011-10-12 (×3): 1 [drp] via OPHTHALMIC
  Filled 2011-09-30: qty 2.5

## 2011-09-30 MED ORDER — RENA-VITE PO TABS
1.0000 | ORAL_TABLET | Freq: Every day | ORAL | Status: DC
  Administered 2011-09-30 – 2011-10-13 (×5): 1 via ORAL
  Filled 2011-09-30 (×15): qty 1

## 2011-09-30 MED ORDER — AMLODIPINE BESYLATE 10 MG PO TABS
10.0000 mg | ORAL_TABLET | Freq: Every day | ORAL | Status: DC
  Administered 2011-09-30: 10 mg via ORAL
  Filled 2011-09-30 (×3): qty 1

## 2011-09-30 MED ORDER — LABETALOL HCL 300 MG PO TABS
300.0000 mg | ORAL_TABLET | Freq: Two times a day (BID) | ORAL | Status: DC
  Administered 2011-09-30 – 2011-10-13 (×7): 300 mg via ORAL
  Filled 2011-09-30 (×30): qty 1

## 2011-09-30 MED ORDER — SODIUM CHLORIDE 0.9 % IV SOLN: INTRAVENOUS | Status: AC

## 2011-09-30 NOTE — Progress Notes (Signed)
Patient ID: Jared [name], male   DOB: [birth year], 78 y.o.   MRN: [id number]  Subjective: No events overnight. Patient denies chest pain, shortness of breath, abdominal pain.   Objective:  Vital signs in last 24 hours:  Filed Vitals:   09/30/11 1000 09/30/11 1400 09/30/11 1625 09/30/11 1630  BP: 158/72 148/70 156/80 164/82  Pulse: 71 72 68 70  Temp: 98.2 F (36.8 C) 98.6 F (37 C)  97.6 F (36.4 C)  TempSrc: Oral Oral  Oral  Resp: 20 20 19 23   Height:      Weight:      SpO2: 98% 96% 95% 95%    Intake/Output from previous day:   Intake/Output Summary (Last 24 hours) at 09/30/11 1643 Last data filed at 09/30/11 1400  Gross per 24 hour  Intake    720 ml  Output      0 ml  Net    720 ml    Physical Exam: General: Alert, awake, oriented x3, in no acute distress. HEENT: No bruits, no goiter. Moist mucous membranes, no scleral icterus, no conjunctival pallor. Heart: Regular rate and rhythm, S1/S2 +, no murmurs, rubs, gallops. Lungs: Clear to auscultation bilaterally. No wheezing, no rhonchi, no rales.  Abdomen: Soft, nontender, nondistended, positive bowel sounds. Extremities: No clubbing or cyanosis, no pitting edema,  positive pedal pulses. Neuro: Grossly nonfocal. Dialysis Access: AVG @ LUA, AVF @ RUA placed on 04/15/11 (ready to use)   Lab Results:  Basic Metabolic Panel:    Component Value Date/Time   NA 141 09/30/2011 0610   K 5.3* 09/30/2011 0610   CL 95* 09/30/2011 0610   CO2 24 09/30/2011 0610   BUN 98* 09/30/2011 0610   CREATININE 19.15* 09/30/2011 0610   GLUCOSE 71 09/30/2011 0610   CALCIUM 8.8 09/30/2011 0610   CBC:    Component Value Date/Time   WBC 8.0 09/30/2011 0610   HGB 8.5* 09/30/2011 0610   HCT 26.0* 09/30/2011 0610   PLT 198 09/30/2011 0610   MCV 99.6 09/30/2011 0610   NEUTROABS 7.0 09/29/2011 1703   LYMPHSABS 1.2 09/29/2011 1703   MONOABS 1.4* 09/29/2011 1703   EOSABS 0.2 09/29/2011 1703   BASOSABS 0.1 09/29/2011 1703      Lab 09/30/11 0610  09/29/11 1703  WBC 8.0 9.8  HGB 8.5* 9.8*  HCT 26.0* 29.4*  PLT 198 218  MCV 99.6 100.0  MCH 32.6 33.3  MCHC 32.7 33.3  RDW 14.3 14.3  LYMPHSABS -- 1.2  MONOABS -- 1.4*  EOSABS -- 0.2  BASOSABS -- 0.1  BANDABS -- --    Lab 09/30/11 0610 09/29/11 1703 09/26/11 1358  NA 141 140 141  K 5.3* 6.6* 5.1  CL 95* 94* 96  CO2 24 25 29   GLUCOSE 71 78 83  BUN 98* 91* 47*  CREATININE 19.15* 18.41* 10.70*  CALCIUM 8.8 9.3 9.6  MG -- -- --    Recent Results (from the past 240 hour(s))  MRSA PCR SCREENING     Status: Abnormal   Collection Time   09/30/11 11:34 AM      Component Value Range Status Comment   MRSA by PCR POSITIVE (*) NEGATIVE  Final     Studies/Results: Ct Head Wo Contrast 09/29/2011   IMPRESSION: Chronic small vessel disease and volume loss. No acute intracranial abnormality.    Dg Chest Port 1 View 09/29/2011    IMPRESSION: Mildly progressive cardiomegaly and pulmonary vascular congestion.   Medications: Scheduled Meds:   . sodium chloride  Intravenous STAT  . amLODipine  10 mg Oral Daily  . aspirin  81 mg Oral Daily  . calcium acetate  1,334 mg Oral TID WC  . cinacalcet  30 mg Oral Q breakfast  . clopidogrel  75 mg Oral Q breakfast  . darbepoetin (ARANESP) injection - DIALYSIS  200 mcg Intravenous Q Wed-HD  . famotidine  20 mg Oral Daily  . labetalol  300 mg Oral BID  . levothyroxine  88 mcg Oral Q0600  . LORazepam  1 mg Intravenous Once  . metoprolol succinate  50 mg Oral Daily  . multivitamin  1 tablet Oral QHS  . naproxen  250 mg Oral BID  . niacin  500 mg Oral Daily  . paricalcitol  8 mcg Intravenous Q M,W,F-HD  . sodium chloride  3 mL Intravenous Q12H  . sodium chloride  3 mL Intravenous Q12H  . sodium polystyrene  45 g Oral Once  . Travoprost (BAK Free)  1 drop Both Eyes [id number]  . DISCONTD: mulitivitamin with minerals  1 tablet Oral Daily   Continuous Infusions:   . sodium chloride 125 mL/hr at 09/29/11 1723   PRN Meds:.acetaminophen,  acetaminophen, heparin, LORazepam, nitroGLYCERIN, ondansetron (ZOFRAN) IV, ondansetron, DISCONTD: ondansetron (ZOFRAN) IV  Assessment/Plan:  Principal Problem:  *Altered mental status - now clinically improving with no physical exam findings of over volume overload - will continue to monitor vitals clinically - HD pending today  Active Problems:  ESRD (end stage renal disease) on dialysis - nephrology following - HD pending for today   Hyperkalemia - Will obtain follow up BMP since pt already received kayexalate   ANEMIA, CHRONIC - Hg drop noted - there is a component of anemia of chronic disease with additional component of yet unclear etiology - will obtain CBC in AM and if Hg < 7.5 will plan to transfuse   HYPOTHYROIDISM - continue synthroid   HYPERTENSION - stable   EDUCATION - test results and diagnostic studies were discussed with patient  - patient verbalized the understanding - questions were answered at the bedside and contact information was provided for additional questions or concerns   LOS: 1 day   Jared [name] 09/30/2011, 4:43 PM  [facility] Pager: [phone]

## 2011-09-30 NOTE — Progress Notes (Signed)
Pt MRSA swab was positive, Dr. Verta Ellen was text paged the results. Pt is already on orange contact.

## 2011-09-30 NOTE — Progress Notes (Signed)
Utilization review complete 

## 2011-09-30 NOTE — Plan of Care (Signed)
Problem: Phase I Progression Outcomes Goal: Voiding-avoid urinary catheter unless indicated Outcome: Progressing Oliguric     

## 2011-09-30 NOTE — Progress Notes (Signed)
Asked pt did he live alone, pt said he lived "with some folks" I asked him were they family? Pt said, "no". I asked him who helps him with his medications the pt said, "I help myself". I asked him how does he get his medications from the pharmacy when they need to be picked up, pt said "I get a ride". Pt inconsistent with answers to questions and is still confused. Will continue to monitor.

## 2011-09-30 NOTE — H&P (Signed)
Jared [name] is an 78 y.o. male.  PCP - Not known.  History obtained from ER physician. Patient is confused and does not provide any history. I tried to reach the family but was unable to. Chief Complaint: Altered mental status. HPI: 78 year old male with history of CAD status post CABG, ESRD on hemodialysis on Tuesday Thursdays and Saturdays, hypertension chronic anemia was brought into the ER because patient was found to be increasingly confused. We do not know how long he has been confused. In the ER he is not found to be in any acute distress or pain. His labs showed hyperkalemia for which he was given Kayexalate. Repeat metabolic panel will be done now. I do not his baseline mental status. When asked if he is hurting anywhere he says no. He's not short of breath not having nausea vomiting or abdominal pain. He just states that he did get some laxative to have bowel movements.  Past Medical History  Diagnosis Date  . Psychosexual dysfunction with inhibited sexual excitement   . Other specified personal history presenting hazards to health   . Generalized osteoarthrosis, unspecified site   . Osteoarthrosis, unspecified whether generalized or localized, unspecified site   . 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 Unspecified hypothyroidism   . Gout   . CAD (coronary artery disease)   . Unspecified deficiency anemia   . Unspecified essential hypertension   . End stage renal disease   . Myocardial infarction 1964  . Back pain     Past Surgical History  Procedure Date  . Cabg     1994  . Av fistula placement   . Coronary artery bypass graft     Family History  Problem Relation Age of Onset  . Heart disease Mother   . Heart disease Father   . Heart disease Sister    Social History:  reports that he quit smoking about 50 years ago. His smoking use included Cigarettes. He quit after 15 years of use. He has never used smokeless tobacco. He  reports that he does not drink alcohol or use illicit drugs.  Allergies: No Known Allergies  Medications Prior to Admission  Medication Dose Route Frequency Provider Last Rate Last Dose  . 0.9 %  sodium chloride infusion   Intravenous Continuous Flint Melter, MD 125 mL/hr at 09/29/11 1723    . LORazepam (ATIVAN) injection 1 mg  1 mg Intravenous Once Flint Melter, MD   1 mg at 09/29/11 1723  . sodium polystyrene (KAYEXALATE) 15 GM/60ML suspension 45 g  45 g Oral Once Flint Melter, MD   45 g at 09/29/11 2042   Medications Prior to Admission  Medication Sig Dispense Refill  . acetaminophen (TYLENOL) 500 MG tablet Take 1,000 mg by mouth 2 (two) times daily.       Marland Kitchen aspirin 81 MG tablet Take 81 mg by mouth. Take 2 tabs daily      . B Complex-C-Folic Acid (DIALYVITE PO) Take 1 tablet by mouth daily.        . calcium acetate (PHOSLO) 667 MG capsule Take 1,334 mg by mouth 3 (three) times daily with meals.        . calcium elemental as carbonate (BARIATRIC TUMS ULTRA) 400 MG tablet Chew 1,000 mg by mouth daily as needed. For heartburn       . cinacalcet (SENSIPAR) 30 MG tablet Take 30 mg by mouth daily.        Marland Kitchen  clopidogrel (PLAVIX) 75 MG tablet Take 75 mg by mouth daily.        . famotidine (PEPCID) 20 MG tablet Take 20 mg by mouth daily.        Marland Kitchen guaiFENesin (MUCINEX) 600 MG 12 hr tablet Take 600 mg by mouth 2 (two) times daily as needed. For cough & congestion       . labetalol (NORMODYNE) 300 MG tablet Take 300 mg by mouth 2 (two) times daily.        . Levothyroxine Sodium 88 MCG CAPS Take 1 capsule by mouth daily.        Marland Kitchen LORazepam (ATIVAN) 1 MG tablet Take 1 tablet (1 mg total) by mouth every 8 (eight) hours as needed (for tremors).  5 tablet  0  . metoprolol (TOPROL-XL) 50 MG 24 hr tablet Take 50 mg by mouth daily.        . Multiple Vitamin (MULTIVITAMIN) tablet Take 1 tablet by mouth daily.        . naproxen sodium (ANAPROX) 220 MG tablet Take 220 mg by mouth 2 (two) times daily.         . niacin (NIASPAN) 500 MG CR tablet Take 500 mg by mouth daily.        . nitroGLYCERIN (NITROLINGUAL) 0.4 MG/SPRAY spray Place 1 spray under the tongue every 5 (five) minutes as needed. For chest pain       . travoprost, benzalkonium, (TRAVATAN Z) 0.004 % ophthalmic solution Place 1 drop into both eyes every morning.         Results for orders placed during the hospital encounter of 09/29/11 (from the past 48 hour(s))  CBC     Status: Abnormal   Collection Time   09/29/11  5:03 PM      Component Value Range Comment   WBC 9.8  4.0 - 10.5 (K/uL)    RBC 2.94 (*) 4.22 - 5.81 (MIL/uL)    Hemoglobin 9.8 (*) 13.0 - 17.0 (g/dL)    HCT 16.1 (*) 09.6 - 52.0 (%)    MCV 100.0  78.0 - 100.0 (fL)    MCH 33.3  26.0 - 34.0 (pg)    MCHC 33.3  30.0 - 36.0 (g/dL)    RDW 04.5  40.9 - 81.1 (%)    Platelets 218  150 - 400 (K/uL)   DIFFERENTIAL     Status: Abnormal   Collection Time   09/29/11  5:03 PM      Component Value Range Comment   Neutrophils Relative 71  43 - 77 (%)    Neutro Abs 7.0  1.7 - 7.7 (K/uL)    Lymphocytes Relative 13  12 - 46 (%)    Lymphs Abs 1.2  0.7 - 4.0 (K/uL)    Monocytes Relative 14 (*) 3 - 12 (%)    Monocytes Absolute 1.4 (*) 0.1 - 1.0 (K/uL)    Eosinophils Relative 2  0 - 5 (%)    Eosinophils Absolute 0.2  0.0 - 0.7 (K/uL)    Basophils Relative 1  0 - 1 (%)    Basophils Absolute 0.1  0.0 - 0.1 (K/uL)   COMPREHENSIVE METABOLIC PANEL     Status: Abnormal   Collection Time   09/29/11  5:03 PM      Component Value Range Comment   Sodium 140  135 - 145 (mEq/L)    Potassium 6.6 (*) 3.5 - 5.1 (mEq/L)    Chloride 94 (*) 96 - 112 (mEq/L)  CO2 25  19 - 32 (mEq/L)    Glucose, Bld 78  70 - 99 (mg/dL)    BUN 91 (*) 6 - 23 (mg/dL)    Creatinine, Ser 16.10 (*) 0.50 - 1.35 (mg/dL)    Calcium 9.3  8.4 - 10.5 (mg/dL)    Total Protein 7.7  6.0 - 8.3 (g/dL)    Albumin 3.5  3.5 - 5.2 (g/dL)    AST 23  0 - 37 (U/L)    ALT 15  0 - 53 (U/L)    Alkaline Phosphatase 87  39 - 117  (U/L)    Total Bilirubin 0.3  0.3 - 1.2 (mg/dL)    GFR calc non Af Amer 2 (*) >90 (mL/min)    GFR calc Af Amer 2 (*) >90 (mL/min)   ETHANOL     Status: Normal   Collection Time   09/29/11  5:03 PM      Component Value Range Comment   Alcohol, Ethyl (B) <11  0 - 11 (mg/dL)   AMMONIA     Status: Normal   Collection Time   09/29/11  5:15 PM      Component Value Range Comment   Ammonia 18  11 - 60 (umol/L)   URINALYSIS, ROUTINE W REFLEX MICROSCOPIC     Status: Abnormal   Collection Time   09/29/11  6:02 PM      Component Value Range Comment   Color, Urine YELLOW  YELLOW     APPearance CLEAR  CLEAR     Specific Gravity, Urine 1.010  1.005 - 1.030     pH >9.0 (*) 5.0 - 8.0     Glucose, UA NEGATIVE  NEGATIVE (mg/dL)    Hgb urine dipstick LARGE (*) NEGATIVE     Bilirubin Urine NEGATIVE  NEGATIVE     Ketones, ur NEGATIVE  NEGATIVE (mg/dL)    Protein, ur >960 (*) NEGATIVE (mg/dL)    Urobilinogen, UA 0.2  0.0 - 1.0 (mg/dL)    Nitrite NEGATIVE  NEGATIVE     Leukocytes, UA NEGATIVE  NEGATIVE    URINE MICROSCOPIC-ADD ON     Status: Abnormal   Collection Time   09/29/11  6:02 PM      Component Value Range Comment   Squamous Epithelial / LPF FEW (*) RARE     RBC / HPF 7-10  <3 (RBC/hpf)    Urine-Other MICROSCOPIC EXAM PERFORMED ON UNCONCENTRATED URINE   LESS THAN 10 mL OF URINE SUBMITTED   Ct Head Wo Contrast  09/29/2011  *RADIOLOGY REPORT*  Clinical Data: 78 year old male with altered mental status. Dialysis.  Involuntary movements.  CT HEAD WITHOUT CONTRAST  Technique:  Contiguous axial images were obtained from the base of the skull through the vertex without contrast.  Comparison: 09/10/2010 and earlier.  Findings: Degenerative changes at C1-C2. Visualized paranasal sinuses and mastoids are clear.  No acute osseous abnormality identified.  Visualized orbit soft tissues are within normal limits.  Calcified atherosclerosis.  Stable scalp soft tissues.  Stable cerebral volume.  Stable  ventriculomegaly.  Confluent white matter hypodensity has not significantly changed.  Chronic right thalamic lacunar infarct appears stable. No midline shift, mass effect, or evidence of mass lesion.  No acute intracranial hemorrhage identified.  No evidence of cortically based acute infarction identified.  IMPRESSION: Chronic small vessel disease and volume loss. No acute intracranial abnormality.  Original Report Authenticated By: Harley Hallmark, M.D.   Dg Chest Port 1 View  09/29/2011  *RADIOLOGY REPORT*  Clinical  Data: Altered mental status.  PORTABLE CHEST - 1 VIEW  Comparison: 03/30/2011.  Findings: Progressive enlargement of the cardiac silhouette with a mild increase in prominence of the pulmonary vasculature.  Stable post CABG changes.  Unremarkable bones.  IMPRESSION: Mildly progressive cardiomegaly and pulmonary vascular congestion.  Original Report Authenticated By: Darrol Angel, M.D.    Review of Systems  Constitutional: Negative.   HENT: Negative.   Eyes: Negative.   Respiratory: Negative.   Cardiovascular: Negative.   Gastrointestinal: Negative.   Genitourinary: Negative.   Musculoskeletal: Negative.   Skin: Negative.   Neurological:       Confusion.  Endo/Heme/Allergies: Negative.   Psychiatric/Behavioral: Negative.     Blood pressure 188/66, pulse 74, temperature 98.7 F (37.1 C), temperature source Axillary, resp. rate 20, SpO2 97.00%. Physical Exam  Constitutional: He appears well-developed and well-nourished. No distress.  HENT:  Head: Normocephalic and atraumatic.  Right Ear: External ear normal.  Left Ear: External ear normal.  Nose: Nose normal.  Mouth/Throat: Oropharynx is clear and moist. No oropharyngeal exudate.  Eyes: Conjunctivae are normal. Pupils are equal, round, and reactive to light. Right eye exhibits no discharge. Left eye exhibits no discharge. No scleral icterus.  Neck: Normal range of motion. Neck supple.  Cardiovascular: Normal rate and  regular rhythm.   Respiratory: Effort normal and breath sounds normal. No respiratory distress. He has no wheezes. He has no rales.  GI: Soft. Bowel sounds are normal. He exhibits no distension. There is no tenderness. There is no rebound.  Musculoskeletal: Normal range of motion. He exhibits no edema and no tenderness.  Neurological: He is alert.       Oriented to his name only. Follows commands. Most upper and lower extremities.  Skin: Skin is warm and dry. He is not diaphoretic. No erythema.     Assessment/Plan #1. Altered mental status - do not know the exact cause. His LFTs and ammonia normal and CAT scan of the head did not show anything acute. Patient is afebrile and does not have leukocytosis. At this time we will check an EEG and MRI of the brain. And closely observed. We need to get in touch with his family to get further history. #2. ESRD on hemodialysis with hyperkalemia - patient was given Kayexalate 45 g after which she had 2 bowel movements. Plan one recheck his metabolic panel now. Have left a message for nephrology for dialysis. If his repeat metabolic panel shows any worsening of hyperkalemia will ask for dialysis. #3. History of CAD status post CABG - presently patient has no chest pain. #4. History of hypothyroidism - continue Synthroid and check TSH. #5. History of chronic anemia - follow CBC.  CODE STATUS - full code.  Eduard Clos. 09/30/2011, 3:39 AM

## 2011-09-30 NOTE — Progress Notes (Signed)
EEG completed at bedside w/ video. 

## 2011-09-30 NOTE — Consult Note (Signed)
[facility] Renal Consultation Note  Indication for Consultation:  Management of ESRD/hemodialysis; anemia, hypertension/volume and secondary hyperparathyroidism  HPI: Jared [name] is a 78 y.o. male with ESRD on HD at the [facility] who lives alone and was brought to the ED early this AM after he was found by his neighbors to be confused with altered mental status.  The patient's mental status is closer to baseline now, and he states that he fell in his bedroom and was calling for help last night.  He denies any pain or injury, but his labwork indicated an elevated potassium of 6.6, for which he was given 45 g of Kayexalate.  Although the patient states that he has not missed any scheduled dialysis, the center confirmed that his last treatment was on 09/24/11.   Dialysis Orders: Center: [facility]  on TTS . EDW 85 kg   HD Bath 2K/2.25Ca  Time 4 hrs  Heparin 2000 U.   Access AVG @ LUA   BFR 450 DFR 800    Zemplar 8 mcg IV/HD   Epogen 2200 Units IV/HD  Venofer  0   Past Medical History  Diagnosis Date  . Psychosexual dysfunction with inhibited sexual excitement   . Other specified personal history presenting hazards to health   . Generalized osteoarthrosis, unspecified site   . Osteoarthrosis, unspecified whether generalized or localized, unspecified site   . 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 Unspecified hypothyroidism   . Gout   . CAD (coronary artery disease)   . Unspecified deficiency anemia   . Unspecified essential hypertension   . End stage renal disease   . Myocardial infarction 1964  . Back pain    Past Surgical History  Procedure Date  . Cabg     1994  . Av fistula placement   . Coronary artery bypass graft    Family History  Problem Relation Age of Onset  . Heart disease Mother   . Heart disease Father   . Heart disease Sister     Social History The patient states that he has always lived in [place].  He separated from  his wife more than ten years ago, but had four children.  He was employed by the [employer].  He reports that he quit smoking cigarettes about 50 years ago.  He has never used smokeless tobacco and reports that he does not drink alcohol or use illicit drugs.  He says he lives in a house and some cousins live with him sometimes  Family History The patient is not certain about his family history, but had two children who were on dialysis and died.  No Known Allergies Prior to Admission medications   Medication Sig Start Date End Date Taking? Authorizing Provider  HYDROcodone-acetaminophen (NORCO) 10-325 MG per tablet Take 1 tablet by mouth every 6 (six) hours as needed. pain   Yes Historical Provider, MD  HYDROcodone-acetaminophen (VICODIN) 5-500 MG per tablet Take 1 tablet by mouth every 6 (six) hours as needed. pain   Yes Historical Provider, MD  acetaminophen (TYLENOL) 500 MG tablet Take 1,000 mg by mouth 2 (two) times daily.     Historical Provider, MD  amLODipine (NORVASC) 10 MG tablet Take 10 mg by mouth daily.    Historical Provider, MD  aspirin 81 MG tablet Take 81 mg by mouth. Take 2 tabs daily    Historical Provider, MD  B Complex-C-Folic Acid (DIALYVITE PO) Take 1 tablet by mouth daily.  Historical Provider, MD  calcium acetate (PHOSLO) 667 MG capsule Take 1,334 mg by mouth 3 (three) times daily with meals.      Historical Provider, MD  calcium elemental as carbonate (BARIATRIC TUMS ULTRA) 400 MG tablet Chew 1,000 mg by mouth daily as needed. For heartburn     Historical Provider, MD  cinacalcet (SENSIPAR) 30 MG tablet Take 30 mg by mouth daily.      Historical Provider, MD  clopidogrel (PLAVIX) 75 MG tablet Take 75 mg by mouth daily.      Historical Provider, MD  famotidine (PEPCID) 20 MG tablet Take 20 mg by mouth daily.      Historical Provider, MD  guaiFENesin (MUCINEX) 600 MG 12 hr tablet Take 600 mg by mouth 2 (two) times daily as needed. For cough & congestion      Historical Provider, MD  labetalol (NORMODYNE) 300 MG tablet Take 300 mg by mouth 2 (two) times daily.      Historical Provider, MD  Levothyroxine Sodium 88 MCG CAPS Take 1 capsule by mouth daily.      Historical Provider, MD  LORazepam (ATIVAN) 1 MG tablet Take 1 tablet (1 mg total) by mouth every 8 (eight) hours as needed (for tremors). 09/22/11 10/02/11  Nat Christen, MD  metoprolol (TOPROL-XL) 50 MG 24 hr tablet Take 50 mg by mouth daily.      Historical Provider, MD  Multiple Vitamin (MULTIVITAMIN) tablet Take 1 tablet by mouth daily.      Historical Provider, MD  naproxen sodium (ANAPROX) 220 MG tablet Take 220 mg by mouth 2 (two) times daily.      Historical Provider, MD  niacin (NIASPAN) 500 MG CR tablet Take 500 mg by mouth daily.      Historical Provider, MD  nitroGLYCERIN (NITROLINGUAL) 0.4 MG/SPRAY spray Place 1 spray under the tongue every 5 (five) minutes as needed. For chest pain     Historical Provider, MD  travoprost, benzalkonium, (TRAVATAN Z) 0.004 % ophthalmic solution Place 1 drop into both eyes every morning.     Historical Provider, MD    Labs:  Results for orders placed during the hospital encounter of 09/29/11 (from the past 48 hour(s))  CBC     Status: Abnormal   Collection Time   09/29/11  5:03 PM      Component Value Range Comment   WBC 9.8  4.0 - 10.5 (K/uL)    RBC 2.94 (*) 4.22 - 5.81 (MIL/uL)    Hemoglobin 9.8 (*) 13.0 - 17.0 (g/dL)    HCT 62.9 (*) 52.8 - 52.0 (%)    MCV 100.0  78.0 - 100.0 (fL)    MCH 33.3  26.0 - 34.0 (pg)    MCHC 33.3  30.0 - 36.0 (g/dL)    RDW 41.3  24.4 - 01.0 (%)    Platelets 218  150 - 400 (K/uL)   DIFFERENTIAL     Status: Abnormal   Collection Time   09/29/11  5:03 PM      Component Value Range Comment   Neutrophils Relative 71  43 - 77 (%)    Neutro Abs 7.0  1.7 - 7.7 (K/uL)    Lymphocytes Relative 13  12 - 46 (%)    Lymphs Abs 1.2  0.7 - 4.0 (K/uL)    Monocytes Relative 14 (*) 3 - 12 (%)    Monocytes Absolute 1.4 (*) 0.1 -  1.0 (K/uL)    Eosinophils Relative 2  0 - 5 (%)  Eosinophils Absolute 0.2  0.0 - 0.7 (K/uL)    Basophils Relative 1  0 - 1 (%)    Basophils Absolute 0.1  0.0 - 0.1 (K/uL)   COMPREHENSIVE METABOLIC PANEL     Status: Abnormal   Collection Time   09/29/11  5:03 PM      Component Value Range Comment   Sodium 140  135 - 145 (mEq/L)    Potassium 6.6 (*) 3.5 - 5.1 (mEq/L)    Chloride 94 (*) 96 - 112 (mEq/L)    CO2 25  19 - 32 (mEq/L)    Glucose, Bld 78  70 - 99 (mg/dL)    BUN 91 (*) 6 - 23 (mg/dL)    Creatinine, Ser 45.40 (*) 0.50 - 1.35 (mg/dL)    Calcium 9.3  8.4 - 10.5 (mg/dL)    Total Protein 7.7  6.0 - 8.3 (g/dL)    Albumin 3.5  3.5 - 5.2 (g/dL)    AST 23  0 - 37 (U/L)    ALT 15  0 - 53 (U/L)    Alkaline Phosphatase 87  39 - 117 (U/L)    Total Bilirubin 0.3  0.3 - 1.2 (mg/dL)    GFR calc non Af Amer 2 (*) >90 (mL/min)    GFR calc Af Amer 2 (*) >90 (mL/min)   ETHANOL     Status: Normal   Collection Time   09/29/11  5:03 PM      Component Value Range Comment   Alcohol, Ethyl (B) <11  0 - 11 (mg/dL)   AMMONIA     Status: Normal   Collection Time   09/29/11  5:15 PM      Component Value Range Comment   Ammonia 18  11 - 60 (umol/L)   URINALYSIS, ROUTINE W REFLEX MICROSCOPIC     Status: Abnormal   Collection Time   09/29/11  6:02 PM      Component Value Range Comment   Color, Urine YELLOW  YELLOW     APPearance CLEAR  CLEAR     Specific Gravity, Urine 1.010  1.005 - 1.030     pH >9.0 (*) 5.0 - 8.0     Glucose, UA NEGATIVE  NEGATIVE (mg/dL)    Hgb urine dipstick LARGE (*) NEGATIVE     Bilirubin Urine NEGATIVE  NEGATIVE     Ketones, ur NEGATIVE  NEGATIVE (mg/dL)    Protein, ur >981 (*) NEGATIVE (mg/dL)    Urobilinogen, UA 0.2  0.0 - 1.0 (mg/dL)    Nitrite NEGATIVE  NEGATIVE     Leukocytes, UA NEGATIVE  NEGATIVE    URINE MICROSCOPIC-ADD ON     Status: Abnormal   Collection Time   09/29/11  6:02 PM      Component Value Range Comment   Squamous Epithelial / LPF FEW (*) RARE       RBC / HPF 7-10  <3 (RBC/hpf)    Urine-Other MICROSCOPIC EXAM PERFORMED ON UNCONCENTRATED URINE   LESS THAN 10 mL OF URINE SUBMITTED  COMPREHENSIVE METABOLIC PANEL     Status: Abnormal   Collection Time   09/30/11  6:10 AM      Component Value Range Comment   Sodium 141  135 - 145 (mEq/L)    Potassium 5.3 (*) 3.5 - 5.1 (mEq/L) DELTA CHECK NOTED   Chloride 95 (*) 96 - 112 (mEq/L)    CO2 24  19 - 32 (mEq/L)    Glucose, Bld 71  70 - 99 (mg/dL)  BUN 98 (*) 6 - 23 (mg/dL)    Creatinine, Ser 45.40 (*) 0.50 - 1.35 (mg/dL)    Calcium 8.8  8.4 - 10.5 (mg/dL)    Total Protein 6.9  6.0 - 8.3 (g/dL)    Albumin 3.2 (*) 3.5 - 5.2 (g/dL)    AST 15  0 - 37 (U/L)    ALT 14  0 - 53 (U/L)    Alkaline Phosphatase 75  39 - 117 (U/L)    Total Bilirubin 0.4  0.3 - 1.2 (mg/dL)    GFR calc non Af Amer 2 (*) >90 (mL/min)    GFR calc Af Amer 2 (*) >90 (mL/min)   CBC     Status: Abnormal   Collection Time   09/30/11  6:10 AM      Component Value Range Comment   WBC 8.0  4.0 - 10.5 (K/uL)    RBC 2.61 (*) 4.22 - 5.81 (MIL/uL)    Hemoglobin 8.5 (*) 13.0 - 17.0 (g/dL)    HCT 98.1 (*) 19.1 - 52.0 (%)    MCV 99.6  78.0 - 100.0 (fL)    MCH 32.6  26.0 - 34.0 (pg)    MCHC 32.7  30.0 - 36.0 (g/dL)    RDW 47.8  29.5 - 62.1 (%)    Platelets 198  150 - 400 (K/uL)   TSH     Status: Abnormal   Collection Time   09/30/11  6:10 AM      Component Value Range Comment   TSH 0.297 (*) 0.350 - 4.500 (uIU/mL)    Constitutional: negative Respiratory: negative Cardiovascular: negative Gastrointestinal: negative Genitourinary:oliguric Musculoskeletal:negative Neurological: negative  Physical Exam: Filed Vitals:   09/30/11 1000  BP: 158/72  Pulse: 71  Temp: 98.2 F (36.8 C)  Resp: 20     General appearance: alert, cooperative, no distress and oriented to name and place Head: Normocephalic, without obvious abnormality, atraumatic Throat: lips, mucosa, and tongue normal; teeth and gums normal Neck: no  adenopathy, no carotid bruit, no JVD and supple, symmetrical, trachea midline Resp: diminished breath sounds bilaterally Cardio: regular rate and rhythm, S1, S2 normal, no murmur, click, rub or gallop GI: soft, non-tender; bowel sounds normal; no masses,  no organomegaly Extremities: extremities normal, atraumatic, no cyanosis or edema Neurologic: Grossly normal Dialysis Access: AVG @ LUA, AVF @ RUA placed on 04/15/11 (ready to use)   Assessment/Plan: 1.  AMS - CT of head with chronic small vessel disease , but no acute abnormality; LFT and ammonia are within normal range, pt afebrile.  EEG and MRI pending. 2.  ESRD -  Last HD on 2/14 as confirmed by [facility], BUN/Cr 98/19.15, K improved to 5.3 (from 6.6) s/p Kayexalate 45 g.  HD pending today. 3.  Hypertension/volume  - BP slightly high, likely secondary to volume overload; chest x-ray with mildly progressive cardiomegaly and pulmonary vascular congestion. 4.  Anemia  - hgb 8.9,  On EPO as outpt.  Rx aranesp 100/wk, Fe/TIBC 29% sat, ferritin 1580- -no IV Fe 5.  Metabolic bone disease -Ca 8.8, alb 3.9, phos 4.5 on 24 Jan,  PTH 308 on 24 Jan on zemplar 8 mcg TIW and PhosLo 2 TID + Sensipar 30/d 6.  Nutrition -alb 3.9  LYLES,CHARLES 09/30/2011, 1:31 PM   Attending Nephrologist:  Unclear why Mr. Recendez has altered mental status.  He says he fell.  CT shows no trauma. Calcium not high.   MRI and EEG are planned Dialysis planned for today.  Not sure  he needs to be on both labetalol and metoprolol   I'm not certain what the 1st 2 problems on his past medical history are (see below) "1.  Psychosexual dysfunction with inhibited sexual excitement.    2.  Other specified personal history presenting hazards to health" I don't think these are pertinent to his current admission

## 2011-09-30 NOTE — Progress Notes (Signed)
Late Entry:  Pt admitted to 6742, placed on tele. Pt states he comes from home alone. When assessed pt is alert to self, disoriented to place and time and at times states inappropriate answers to questions. He has no signs of tremors, twitching, or jerking. Pt states he is ambulatory with assistance, using a cane at home. He has a scab to the right knee and skin is dry and flaky, no other sign issues noted.  Pt has been reoriented when showing signs of confusion, Pt oriented to unit, staff, and safety. Bed alarm on for increased safety. Will continue to monitor

## 2011-10-01 ENCOUNTER — Inpatient Hospital Stay (HOSPITAL_COMMUNITY): Payer: Medicare Other

## 2011-10-01 ENCOUNTER — Observation Stay (HOSPITAL_COMMUNITY): Payer: Medicare Other

## 2011-10-01 LAB — RENAL FUNCTION PANEL
BUN: 32 mg/dL — ABNORMAL HIGH (ref 6–23)
Calcium: 8.8 mg/dL (ref 8.4–10.5)
GFR calc Af Amer: 6 mL/min — ABNORMAL LOW (ref >90)
Glucose, Bld: 131 mg/dL — ABNORMAL HIGH (ref 70–99)
Phosphorus: 4.9 mg/dL — ABNORMAL HIGH (ref 2.3–4.6)
Sodium: 141 mEq/L (ref 135–145)

## 2011-10-01 LAB — CBC
Hemoglobin: 10 g/dL — ABNORMAL LOW (ref 13.0–17.0)
MCH: 33.1 pg (ref 26.0–34.0)
MCHC: 32.7 g/dL (ref 30.0–36.0)
RDW: 13.9 % (ref 11.5–15.5)

## 2011-10-01 LAB — GLUCOSE, CAPILLARY

## 2011-10-01 MED ORDER — HEPARIN SODIUM (PORCINE) 1000 UNIT/ML DIALYSIS
20.0000 [IU]/kg | INTRAMUSCULAR | Status: DC | PRN
  Filled 2011-10-01: qty 2

## 2011-10-01 MED ORDER — LORAZEPAM 2 MG/ML IJ SOLN
1.0000 mg | Freq: Once | INTRAMUSCULAR | Status: AC
  Administered 2011-10-01: 1 mg via INTRAMUSCULAR
  Filled 2011-10-01: qty 1

## 2011-10-01 MED ORDER — LORAZEPAM 2 MG/ML IJ SOLN
1.0000 mg | INTRAMUSCULAR | Status: DC | PRN
  Administered 2011-10-02: 1 mg via INTRAVENOUS
  Filled 2011-10-01 (×3): qty 1

## 2011-10-01 MED ORDER — LORAZEPAM 2 MG/ML IJ SOLN: 1.0000 mg | Freq: Once | INTRAMUSCULAR | Status: DC

## 2011-10-01 MED ORDER — LORAZEPAM 2 MG/ML IJ SOLN
INTRAMUSCULAR | Status: AC
  Administered 2011-10-01: 1 mg via INTRAMUSCULAR
  Filled 2011-10-01: qty 1

## 2011-10-01 MED ORDER — LORAZEPAM 2 MG/ML IJ SOLN
1.0000 mg | Freq: Once | INTRAMUSCULAR | Status: AC
  Administered 2011-10-01: 1 mg via INTRAVENOUS

## 2011-10-01 NOTE — Progress Notes (Signed)
On HD via L upper arm AVG BP 135/57 Goal 1 L (he's below EDW as outpatient--EDW 85 kg and weight this AM was 82.3 kg) Hgb 10 K 3.8 (He had dialysis last night) Wt Readings from Last 3 Encounters:  10/01/11 82.3 kg (181 lb 7 oz)  09/18/11 90.719 kg (200 lb)  09/16/11 90.719 kg (200 lb)  His mental status is back to baseline (in my opinion).

## 2011-10-01 NOTE — Progress Notes (Signed)
Pt returned from HD clearly confused. As I interacted with the pt and tried to give him his medications, he refused, and became increasingly agitated and combative. The nurse manager was called into the room to try and calm the patient, but he only became more agitated, and yelled, "you all are crooks!", "we're not in a hospital, I'm on my front porch", and threatened on several occasions to leave the hospital. Security was then called, IM Ativan was given according to MD order, and bilateral wrist restraints were applied. I attempted to call family to discuss Jared [name], but the only person on file is Jared [name] niece, who has not been in touch with her uncle for more than a year, and could not tell me any relevant information about Jared [name]. She suggested asking the pt for his family's contact information, but as the pt is in such a confused, agitated state, I will wait and try and obtain family contact info later in the shift.

## 2011-10-01 NOTE — Progress Notes (Signed)
Security to pt room to assist with calming and returning him to bed. Pt. Verbally and physically aggressive to staff. Security assist to transfer pt to bed as he continues to resist. Pt. Placed in bilateral soft wrist restraints with all four side rails up. Tech to room for continued monitoring and pt safety. Pt. Declines food, beverage or toileting at this time.Dondra Spry

## 2011-10-01 NOTE — Progress Notes (Signed)
Subjective:   No current complaints, feeling better  Objective: Vital signs in last 24 hours: Temp:  [97.6 F (36.4 C)-98.7 F (37.1 C)] 98.7 F (37.1 C) (02/21 0511) Pulse Rate:  [63-74] 63  (02/21 0511) Resp:  [18-25] 20  (02/21 0511) BP: (116-178)/(68-95) 116/78 mmHg (02/21 0511) SpO2:  [94 %-98 %] 94 % (02/21 0511) FiO2 (%):  [2 %] 2 % (02/20 2045) Weight:  [85.1 kg (187 lb 9.8 oz)] 85.1 kg (187 lb 9.8 oz) (02/20 2045) Weight change: -4.3 kg (-9 lb 7.7 oz)  Intake/Output from previous day: 02/20 0701 - 02/21 0700 In: 480 [P.O.:480] Out: 4500    EXAM: General appearance: Alert, mental status likely at baseline, in no apparent distress Resp:  CTA bilaterally Cardio:  RRR without murmur GI: + BS, soft and nontender Extremities:  No edema Access:  AVG @ LUA, AVF @ RUA placed 04/15/11 (ready to use)  Lab Results:  Basename 10/01/11 0630 09/30/11 0610  WBC 6.0 8.0  HGB 10.0* 8.5*  HCT 30.6* 26.0*  PLT 207 198   BMET:  Basename 09/30/11 0610 09/29/11 1703  NA 141 140  K 5.3* 6.6*  CL 95* 94*  CO2 24 25  GLUCOSE 71 78  BUN 98* 91*  CREATININE 19.15* 18.41*  CALCIUM 8.8 9.3  ALBUMIN 3.2* 3.5   No results found for this basename: PTH:2 in the last 72 hours Iron Studies: No results found for this basename: IRON,TIBC,TRANSFERRIN,FERRITIN in the last 72 hours  Assessment/Plan: 1.  AMS - CT of head with chronic small vessel disease , but no acute abnormality; LFT and ammonia are within normal range, pt afebrile; EEG yesterday with "left paracentral and left parietal sharp waves", suggesting "an underlying focus of cortical irritability with a potential for epileptogenesis".  Neurology following. 2.  ESRD - on HD on TTS, s/p HD yesterday (first since 2/14), BUN/Cr 98/19.15, K improved to 5.3 pre-HD (from 6.6) s/p Kayexalate 45 g.  3-hr HD today to resume regular dialysis schedule. 3.  Hypertension/volume - BP stable post-HD yesterday; pre-HD chest x-ray yesterday with mildly  progressive cardiomegaly and pulmonary vascular congestion; net UF of 4.5 L yesterday.  4.  Anemia - hgb 10 today, on Epogen 2200 U as outpt, s/p Aranesp 200 mcg yesterday, Fe/TIBC 29% sat, ferritin 1580-  -no IV Fe.  5.  Metabolic bone disease -Ca 8.8, alb 3.2; phos 4.5 on 24 Jan, PTH 308 on 24 Jan on Zemplar 8 mcg TIW and PhosLo 2 TID + Sensipar 30/d.  6.  Nutrition -alb 3.2.     LOS: 2 days   LYLES,CHARLES 10/01/2011,7:24 AM  I saw Mr. Stefanski with Gerome Apley, PA-C.  Agree with plans--for HD tomorrow.  Mental status is a little better

## 2011-10-01 NOTE — Procedures (Signed)
EEG ID:  [id number].  HISTORY:  This is a 78 years old man with end-stage renal disease on hemodialysis, altered mental status.  MEDICATIONS:  Ativan.  CONDITION OF RECORDING:  This 16-lead EEG was recorded with the patient in awake and drowsy states.  Background rhythm: background patterns in wakefulness were well organized with a well-sustained posterior dominant rhythm of 10 Hz, symmetrical and reactive to eye opening and closing. Drowsiness was associated with mild attenuation of voltage and slowing of frequencies.  Abnormal potentials: intermittent C4-T4 sharp waves were noted.  No focal slowing was noted.  ACTIVATION PROCEDURES:  Hyperventilation was not performed.  Photic stimulation did not activate tracing.  EKG:  Single-channel of EKG monitoring was unremarkable.  IMPRESSION:  This was an abnormal awake and drowsy EEG due to presence of left paracentral and left parietal sharp waves.  This finding may be suggestive of an underlying focus of cortical irritability with a potential for epileptogenesis in that head region.  Clinical correlation is suggested.          ______________________________ Carmell Austria, MD    [id number] D:  09/30/2011 13:39:45  T:  09/30/2011 21:18:31  Job #:  [id number]

## 2011-10-01 NOTE — Progress Notes (Signed)
Patient ID: Jared [name], male   DOB: [birth year], 78 y.o.   MRN: [id number]  Subjective: No events overnight. Patient denies chest pain, shortness of breath, abdominal pain. Had bowel movement and reports ambulating.  Objective:  Vital signs in last 24 hours:  Filed Vitals:   10/01/11 1159 10/01/11 1230 10/01/11 1302 10/01/11 1455  BP: 124/70 158/85 132/74 173/70  Pulse: 62 97 68 68  Temp:   99.4 F (37.4 C) 99.1 F (37.3 C)  TempSrc:   Oral Oral  Resp: 20 22 21 20   Height:      Weight:   81.5 kg (179 lb 10.8 oz)   SpO2: 100% 100% 100% 100%    Intake/Output from previous day:   Intake/Output Summary (Last 24 hours) at 10/01/11 1742 Last data filed at 10/01/11 1400  Gross per 24 hour  Intake    240 ml  Output   5800 ml  Net  -5560 ml    Physical Exam: General: Alert, awake, oriented x3, in no acute distress. HEENT: No bruits, no goiter. Moist mucous membranes, no scleral icterus, no conjunctival pallor. Heart: Regular rate and rhythm, S1/S2 +, no murmurs, rubs, gallops. Lungs: Clear to auscultation bilaterally. No wheezing, no rhonchi, no rales.  Abdomen: Soft, nontender, nondistended, positive bowel sounds. Extremities: No clubbing or cyanosis, no pitting edema,  positive pedal pulses. Neuro: Grossly nonfocal.  Lab Results:  Basic Metabolic Panel:    Component Value Date/Time   NA 141 10/01/2011 0630   K 3.8 10/01/2011 0630   CL 98 10/01/2011 0630   CO2 29 10/01/2011 0630   BUN 32* 10/01/2011 0630   CREATININE 9.37* 10/01/2011 0630   GLUCOSE 131* 10/01/2011 0630   CALCIUM 8.8 10/01/2011 0630   CBC:    Component Value Date/Time   WBC 6.0 10/01/2011 0630   HGB 10.0* 10/01/2011 0630   HCT 30.6* 10/01/2011 0630   PLT 207 10/01/2011 0630   MCV 101.3* 10/01/2011 0630   NEUTROABS 7.0 09/29/2011 1703   LYMPHSABS 1.2 09/29/2011 1703   MONOABS 1.4* 09/29/2011 1703   EOSABS 0.2 09/29/2011 1703   BASOSABS 0.1 09/29/2011 1703      Lab 10/01/11 0630 09/30/11 0610 09/29/11  1703  WBC 6.0 8.0 9.8  HGB 10.0* 8.5* 9.8*  HCT 30.6* 26.0* 29.4*  PLT 207 198 218  MCV 101.3* 99.6 100.0  MCH 33.1 32.6 33.3  MCHC 32.7 32.7 33.3  RDW 13.9 14.3 14.3  LYMPHSABS -- -- 1.2  MONOABS -- -- 1.4*  EOSABS -- -- 0.2  BASOSABS -- -- 0.1  BANDABS -- -- --    Lab 10/01/11 0630 09/30/11 0610 09/29/11 1703 09/26/11 1358  NA 141 141 140 141  K 3.8 5.3* 6.6* 5.1  CL 98 95* 94* 96  CO2 29 24 25 29   GLUCOSE 131* 71 78 83  BUN 32* 98* 91* 47*  CREATININE 9.37* 19.15* 18.41* 10.70*  CALCIUM 8.8 8.8 9.3 9.6  MG -- -- -- --   No results found for this basename: INR:5,PROTIME:5 in the last 168 hours Cardiac markers: No results found for this basename: CK:3,CKMB:3,TROPONINI:3,MYOGLOBIN:3 in the last 168 hours No components found with this basename: POCBNP:3 Recent Results (from the past 240 hour(s))  URINE CULTURE     Status: Normal   Collection Time   09/29/11  6:02 PM      Component Value Range Status Comment   Specimen Description URINE, CLEAN CATCH   Final    Special Requests NONE   Final  Culture  Setup Time [id number]   Final    Colony Count NO GROWTH   Final    Culture NO GROWTH   Final    Report Status 09/30/2011 FINAL   Final   MRSA PCR SCREENING     Status: Abnormal   Collection Time   09/30/11 11:34 AM      Component Value Range Status Comment   MRSA by PCR POSITIVE (*) NEGATIVE  Final     Studies/Results: Ct Head Wo Contrast  09/29/2011  *RADIOLOGY REPORT*  Clinical Data: 78 year old male with altered mental status. Dialysis.  Involuntary movements.  CT HEAD WITHOUT CONTRAST  Technique:  Contiguous axial images were obtained from the base of the skull through the vertex without contrast.  Comparison: 09/10/2010 and earlier.  Findings: Degenerative changes at C1-C2. Visualized paranasal sinuses and mastoids are clear.  No acute osseous abnormality identified.  Visualized orbit soft tissues are within normal limits.  Calcified atherosclerosis.  Stable scalp  soft tissues.  Stable cerebral volume.  Stable ventriculomegaly.  Confluent white matter hypodensity has not significantly changed.  Chronic right thalamic lacunar infarct appears stable. No midline shift, mass effect, or evidence of mass lesion.  No acute intracranial hemorrhage identified.  No evidence of cortically based acute infarction identified.  IMPRESSION: Chronic small vessel disease and volume loss. No acute intracranial abnormality.  Original Report Authenticated By: Harley Hallmark, M.D.    Medications: Scheduled Meds:   . sodium chloride   Intravenous STAT  . amLODipine  10 mg Oral Daily  . aspirin  81 mg Oral Daily  . calcium acetate  1,334 mg Oral TID WC  . cinacalcet  30 mg Oral Q breakfast  . clopidogrel  75 mg Oral Q breakfast  . darbepoetin (ARANESP) injection - DIALYSIS  200 mcg Intravenous Q Wed-HD  . famotidine  20 mg Oral Daily  . labetalol  300 mg Oral BID  . levothyroxine  88 mcg Oral Q0600  . LORazepam  1 mg Intramuscular Once  . LORazepam  1 mg Intramuscular Once  . LORazepam  1 mg Intravenous Once  . metoprolol succinate  50 mg Oral Daily  . multivitamin  1 tablet Oral QHS  . naproxen  250 mg Oral BID  . niacin  500 mg Oral Daily  . paricalcitol  8 mcg Intravenous Q M,W,F-HD  . sodium chloride  3 mL Intravenous Q12H  . sodium chloride  3 mL Intravenous Q12H  . Travoprost (BAK Free)  1 drop Both Eyes BH-q7a   Continuous Infusions:  PRN Meds:.acetaminophen, acetaminophen, heparin, heparin, LORazepam, nitroGLYCERIN, ondansetron (ZOFRAN) IV, ondansetron, DISCONTD: LORazepam  Assessment/Plan:  Principal Problem:  *Altered mental status  - now clinically improving with no physical exam findings of over volume overload  - will continue to monitor vitals clinically  - HD pending tomorrow - MRI pending  Active Problems:  ESRD (end stage renal disease) on dialysis  - nephrology following  - HD pending for tomorrow  Hyperkalemia  - Will obtain follow up  BMP since pt already received kayexalate   ANEMIA, CHRONIC  - Hg remains stable  - there is a component of anemia of chronic disease with additional component of yet unclear etiology  - will obtain CBC in AM and if Hg < 7.5 will plan to transfuse   HYPOTHYROIDISM  - continue synthroid   HYPERTENSION  - stable   EDUCATION  - test results and diagnostic studies were discussed with patient  - patient verbalized the  understanding  - questions were answered at the bedside and contact information was provided for additional questions or concerns    LOS: 2 days   MAGICK-Jinger Middlesworth 10/01/2011, 5:42 PM  [facility] Pager: [phone]

## 2011-10-02 ENCOUNTER — Inpatient Hospital Stay (HOSPITAL_COMMUNITY): Payer: Medicare Other

## 2011-10-02 LAB — RENAL FUNCTION PANEL
CO2: 27 mEq/L (ref 19–32)
Calcium: 9.1 mg/dL (ref 8.4–10.5)
Creatinine, Ser: 7.45 mg/dL — ABNORMAL HIGH (ref 0.50–1.35)
Glucose, Bld: 120 mg/dL — ABNORMAL HIGH (ref 70–99)

## 2011-10-02 LAB — CBC
HCT: 35.9 % — ABNORMAL LOW (ref 39.0–52.0)
Hemoglobin: 11.5 g/dL — ABNORMAL LOW (ref 13.0–17.0)
WBC: 6.8 10*3/uL (ref 4.0–10.5)

## 2011-10-02 MED ORDER — AMLODIPINE BESYLATE 10 MG PO TABS
10.0000 mg | ORAL_TABLET | Freq: Every day | ORAL | Status: DC
  Administered 2011-10-11 – 2011-10-12 (×2): 10 mg via ORAL
  Filled 2011-10-02 (×12): qty 1

## 2011-10-02 MED ORDER — MUPIROCIN 2 % EX OINT
1.0000 "application " | TOPICAL_OINTMENT | Freq: Two times a day (BID) | CUTANEOUS | Status: AC
  Administered 2011-10-04 – 2011-10-07 (×2): 1 via NASAL
  Filled 2011-10-02: qty 22

## 2011-10-02 MED ORDER — CHLORHEXIDINE GLUCONATE CLOTH 2 % EX PADS
6.0000 | MEDICATED_PAD | Freq: Every day | CUTANEOUS | Status: AC
  Administered 2011-10-02: 6 via TOPICAL

## 2011-10-02 MED ORDER — HALOPERIDOL LACTATE 5 MG/ML IJ SOLN
1.0000 mg | Freq: Once | INTRAMUSCULAR | Status: AC
  Administered 2011-10-02: 1 mg via INTRAVENOUS
  Filled 2011-10-02: qty 0.2

## 2011-10-02 NOTE — Progress Notes (Addendum)
[facility] Progress Note Subjective:  Told lab tech he was at the dentist's office. "You the ones that are crazy; I'm going home today"  Objective Filed Vitals:   10/01/11 2152 10/02/11 0439 10/02/11 0500 10/02/11 0911  BP:  187/85  159/102  Pulse:  70  74  Temp:  98.4 F (36.9 C)  98.4 F (36.9 C)  TempSrc:  Oral  Oral  Resp:  20  21  Height: 5\' 5"  (1.651 m)     Weight: 81.6 kg (179 lb 14.3 oz)  81.6 kg (179 lb 14.3 oz)   SpO2:  100%  95%   Physical Exam: BP cuff on top of  right upper AVF; IV in right hand; wrists in restraints General: mildly confused Heart: RRR Lungs: grossly CTA Abdomen: soft NT Extremities: no lower extremity edema Dialysis Access: left upper AVGG and right upper AVF - both patent Neuro: after I oriented him to [facility], he said it was in [place], year "13"; president = Elige Radon- saw on TV today talking about guns  Problem/Plan: 1. AMS - episode of agitation and combativeness last evening; transferred to camera monitored room. Pull out IV, verbally abusive; calmer this am; no insight into behavioral change. ? Significance of recent EEG 2. ESRD - TTS HD tomorrow; below outpt EDW of 85; titrate down as able; notified nursing staff of need for no upper extremity BPs 3. Anemia - Hgb variable; Epo 2200 outpt changed to 200 Aranesp 2/20; no need for Fe 4. Secondary hyperparathyroidism - well controlled; continue Zemplar 8 and phoslo 2 and sensipar 30 5. HTN/volume - Tsats good; hypertensive; change to pm dose of qd norvasc; on metoprolol AND labetolol??? Outpt medlist only has labetalol. Will d/c metoprolol- already should have received dose today. 6. Nutrition - increase to 80/90 protein renal diet; add supplements; albumin 3.2 7. Hypothyroidism - TSH low at 0.297- defer to primary  Additional Objective Labs: Basic Metabolic Panel:  Lab 10/01/11 1610 09/30/11 0610 09/29/11 1703  NA 141 141 140  K 3.8 5.3* 6.6*  CL 98 95* 94*  CO2 29 24 25    GLUCOSE 131* 71 78  BUN 32* 98* 91*  CREATININE 9.37* 19.15* 18.41*  CALCIUM 8.8 8.8 9.3  ALB -- -- --  PHOS 4.9* -- --   Liver Function Tests:  Lab 10/01/11 0630 09/30/11 0610 09/29/11 1703  AST -- 15 23  ALT -- 14 15  ALKPHOS -- 75 87  BILITOT -- 0.4 0.3  PROT -- 6.9 7.7  ALBUMIN 3.2* 3.2* 3.5   No results found for this basename: LIPASE:3,AMYLASE:3 in the last 168 hours  Lab 09/29/11 1715  AMMONIA 18   CBC:  Lab 10/01/11 0630 09/30/11 0610 09/29/11 1703  WBC 6.0 8.0 9.8  NEUTROABS -- -- 7.0  HGB 10.0* 8.5* 9.8*  HCT 30.6* 26.0* 29.4*  MCV 101.3* 99.6 100.0  PLT 207 198 218   CBG:  Lab 10/01/11 2132  GLUCAP 96   IMedications:      . amLODipine  10 mg Oral Daily  . aspirin  81 mg Oral Daily  . calcium acetate  1,334 mg Oral TID WC  . Chlorhexidine Gluconate Cloth  6 each Topical Q0600  . cinacalcet  30 mg Oral Q breakfast  . clopidogrel  75 mg Oral Q breakfast  . darbepoetin (ARANESP) injection - DIALYSIS  200 mcg Intravenous Q Wed-HD  . famotidine  20 mg Oral Daily  . labetalol  300 mg Oral BID  .  levothyroxine  88 mcg Oral Q0600  . LORazepam  1 mg Intramuscular Once  . LORazepam  1 mg Intramuscular Once  . LORazepam  1 mg Intravenous Once  . metoprolol succinate  50 mg Oral Daily  . multivitamin  1 tablet Oral QHS  . mupirocin ointment  1 application Nasal BID  . naproxen  250 mg Oral BID  . niacin  500 mg Oral Daily  . paricalcitol  8 mcg Intravenous Q M,W,F-HD  . sodium chloride  3 mL Intravenous Q12H  . Travoprost (BAK Free)  1 drop Both Eyes BH-q7a  . DISCONTD: sodium chloride  3 mL Intravenous Q12H    I  have reviewed scheduled and prn medications.  Sheffield Slider, PA-C [facility] Beeper [phone]  10/02/2011,10:26 AM  LOS: 3 days   Still restrained.  He  Thinks he[facility] "at the [place] or [facility]. Dialysisis sched for tomorrow

## 2011-10-02 NOTE — Progress Notes (Signed)
During report at shift change, patient had been transferred from 6742 down to 6702 to be closely monitored because he had become progressively agitated and combative.  He is in bilateral wrist restraints and has Q2 checks on the restraints.  Patient is oriented to himself but not to his location, the current date or time.  He is very verbally abusive and will not take any medications. He pulled his IV out before he was placed in restraints and moved from 6742 to 6702.  IV team has been called and notified of placement of new IV. Will continue to monitor pt closely.  Pt is in a camera room but is not coherent enough to explain the reasoning behind being placed in this type room.

## 2011-10-02 NOTE — Progress Notes (Signed)
10/02/11 1543 MRI up to get patient and patient is refusing to go pt is confused but combative he is cursing hitting at nurses and kicking  . It took three nurses to be able to give him Ativan 1mg  as ordered Iv push , will continue to monitor call MD and check his restraints as ordered MRI recalled and not sure he will cooperate [employer] RN,BSN

## 2011-10-02 NOTE — Progress Notes (Signed)
Patient ID: Jared [name], male   DOB: 1934-02-13, 78 y.o.   MRN: [id number]  Subjective: No events overnight. Patient denies chest pain, shortness of breath, abdominal pain. Had bowel movement and reports ambulating.  Assessment/Plan:   Principal Problem:   *Altered mental status  - now clinically improving with no physical exam findings of over volume overload  - will continue to monitor vitals clinically  - MRI pending   Active Problems:   ESRD (end stage renal disease) on dialysis  - nephrology following  - HD as per renal schedule  Hyperkalemia  - K = 4.3  ANEMIA, CHRONIC  - Hg remains stable  - there is a component of anemia of chronic disease with additional component of yet unclear etiology  - Hgb stable at 11.5  HYPOTHYROIDISM  - continue synthroid   HYPERTENSION  - stable   EDUCATION  - test results and diagnostic studies were discussed with patient  - patient verbalized the understanding  - questions were answered at the bedside and contact information was provided for additional questions or concerns     Objective:  Vital signs in last 24 hours:  Filed Vitals:   10/02/11 0911 10/02/11 1253 10/02/11 2144  BP: 159/102 158/101 167/87  Pulse: 74 78 69  Temp: 98.4 F (36.9 C)  97.2 F (36.2 C)  TempSrc: Oral  Oral  Resp: 21 20 18   SpO2: 95% 95% 95%   Physical Exam: General: Alert, awake, oriented x3, in no acute distress. HEENT: No bruits, no goiter. Moist mucous membranes, no scleral icterus, no conjunctival pallor. Heart: Regular rate and rhythm, S1/S2 +, no murmurs, rubs, gallops. Lungs: Clear to auscultation bilaterally. No wheezing, no rhonchi, no rales.  Abdomen: Soft, nontender, nondistended, positive bowel sounds. Extremities: No clubbing or cyanosis, no pitting edema,  positive pedal pulses. Neuro: Grossly nonfocal.  Lab Results:   Lab 10/02/11 1045 10/01/11 0630 09/30/11 0610 09/29/11 1703  WBC 6.8 6.0 8.0 9.8  HGB 11.5* 10.0* 8.5*  9.8*  HCT 35.9* 30.6* 26.0* 29.4*  PLT 238 207 198 218  MCV 102.0* 101.3* 99.6 100.0    Lab 10/02/11 1045 10/01/11 0630 09/30/11 0610 09/29/11 1703 09/26/11 1358  NA 140 141 141 140 141  K 4.3 3.8 5.3* 6.6* 5.1  CL 98 98 95* 94* 96  CO2 27 29 24 25 29   GLUCOSE 120* 131* 71 78 83  BUN 18 32* 98* 91* 47*  CREATININE 7.45* 9.37* 19.15* 18.41* 10.70*  CALCIUM 9.1 8.8 8.8 9.3 9.6    Recent Results (from the past 240 hour(s))  URINE CULTURE     Status: Normal   Collection Time   09/29/11  6:02 PM      Component Value Range Status Comment   Specimen Description URINE, CLEAN CATCH   Final    Special Requests NONE   Final    Culture  Setup Time [id number]   Final    Colony Count NO GROWTH   Final    Culture NO GROWTH   Final    Report Status 09/30/2011 FINAL   Final   MRSA PCR SCREENING     Status: Abnormal   Collection Time   09/30/11 11:34 AM      Component Value Range Status Comment   MRSA by PCR POSITIVE (*) NEGATIVE  Final     Medications: Scheduled Meds:   . amLODipine  10 mg Oral Daily  . aspirin  81 mg Oral Daily  . calcium acetate  1,334  mg Oral TID WC  . Chlorhexidine Gluconate Cloth  6 each Topical Q0600  . cinacalcet  30 mg Oral Q breakfast  . clopidogrel  75 mg Oral Q breakfast  . darbepoetin (ARANESP) injection - DIALYSIS  200 mcg Intravenous Q Wed-HD  . famotidine  20 mg Oral Daily  . haloperidol lactate  1 mg Intravenous Once  . labetalol  300 mg Oral BID  . levothyroxine  88 mcg Oral Q0600  . LORazepam  1 mg Intramuscular Once  . multivitamin  1 tablet Oral QHS  . mupirocin ointment  1 application Nasal BID  . naproxen  250 mg Oral BID  . niacin  500 mg Oral Daily  . paricalcitol  8 mcg Intravenous Q M,W,F-HD  . sodium chloride  3 mL Intravenous Q12H  . Travoprost (BAK Free)  1 drop Both Eyes BH-q7a     LOS: 3 days   Quenten Nawaz 10/02/2011, 11:20 PM  [facility] Pager: [phone]

## 2011-10-02 NOTE — Progress Notes (Signed)
PT Cancellation Note  Treatment cancelled today due to pt uncooperative and unable to participate.Marland Kitchen  Jared [name] 10/02/2011, 11:50 AM

## 2011-10-02 NOTE — Progress Notes (Signed)
Pt off unit

## 2011-10-03 ENCOUNTER — Inpatient Hospital Stay (HOSPITAL_COMMUNITY): Payer: Medicare Other

## 2011-10-03 LAB — COMPREHENSIVE METABOLIC PANEL
ALT: 15 U/L (ref 0–53)
AST: 17 U/L (ref 0–37)
Albumin: 3.3 g/dL — ABNORMAL LOW (ref 3.5–5.2)
Alkaline Phosphatase: 85 U/L (ref 39–117)
BUN: 32 mg/dL — ABNORMAL HIGH (ref 6–23)
CO2: 25 mEq/L (ref 19–32)
Calcium: 9.3 mg/dL (ref 8.4–10.5)
Chloride: 95 mEq/L — ABNORMAL LOW (ref 96–112)
Creatinine, Ser: 10.15 mg/dL — ABNORMAL HIGH (ref 0.50–1.35)
GFR calc Af Amer: 5 mL/min — ABNORMAL LOW (ref >90)
GFR calc non Af Amer: 4 mL/min — ABNORMAL LOW (ref >90)
Glucose, Bld: 77 mg/dL (ref 70–99)
Potassium: 5 mEq/L (ref 3.5–5.1)
Sodium: 140 mEq/L (ref 135–145)
Total Bilirubin: 0.3 mg/dL (ref 0.3–1.2)
Total Protein: 7.7 g/dL (ref 6.0–8.3)

## 2011-10-03 LAB — CBC
HCT: 34.5 % — ABNORMAL LOW (ref 39.0–52.0)
Hemoglobin: 11.4 g/dL — ABNORMAL LOW (ref 13.0–17.0)
MCH: 33.2 pg (ref 26.0–34.0)
MCHC: 33 g/dL (ref 30.0–36.0)
MCV: 100.6 fL — ABNORMAL HIGH (ref 78.0–100.0)
Platelets: 258 10*3/uL (ref 150–400)
RBC: 3.43 MIL/uL — ABNORMAL LOW (ref 4.22–5.81)
RDW: 13.6 % (ref 11.5–15.5)
WBC: 6.3 10*3/uL (ref 4.0–10.5)

## 2011-10-03 LAB — RENAL FUNCTION PANEL
Albumin: 3.3 g/dL — ABNORMAL LOW (ref 3.5–5.2)
BUN: 30 mg/dL — ABNORMAL HIGH (ref 6–23)
CO2: 27 mEq/L (ref 19–32)
Calcium: 9.2 mg/dL (ref 8.4–10.5)
Chloride: 97 mEq/L (ref 96–112)
Creatinine, Ser: 10.05 mg/dL — ABNORMAL HIGH (ref 0.50–1.35)
GFR calc Af Amer: 5 mL/min — ABNORMAL LOW (ref >90)
GFR calc non Af Amer: 4 mL/min — ABNORMAL LOW (ref >90)
Glucose, Bld: 81 mg/dL (ref 70–99)
Phosphorus: 4.2 mg/dL (ref 2.3–4.6)
Potassium: 4.8 mEq/L (ref 3.5–5.1)
Sodium: 140 mEq/L (ref 135–145)

## 2011-10-03 LAB — PHOSPHORUS: Phosphorus: 4.3 mg/dL (ref 2.3–4.6)

## 2011-10-03 LAB — HEPATITIS B SURFACE ANTIGEN: Hepatitis B Surface Ag: NEGATIVE

## 2011-10-03 MED ORDER — HALOPERIDOL LACTATE 5 MG/ML IJ SOLN
1.0000 mg | Freq: Two times a day (BID) | INTRAMUSCULAR | Status: DC | PRN
  Administered 2011-10-03: 1 mg via INTRAVENOUS
  Filled 2011-10-03: qty 1

## 2011-10-03 MED ORDER — PARICALCITOL 5 MCG/ML IV SOLN
INTRAVENOUS | Status: AC
  Administered 2011-10-03: 8 ug via INTRAVENOUS
  Filled 2011-10-03: qty 2

## 2011-10-03 MED ORDER — PARICALCITOL 5 MCG/ML IV SOLN
8.0000 ug | INTRAVENOUS | Status: DC
  Administered 2011-10-03 – 2011-10-13 (×4): 8 ug via INTRAVENOUS
  Filled 2011-10-03 (×5): qty 1.6

## 2011-10-03 NOTE — Progress Notes (Addendum)
[facility]  Less agitated today On HD via L upper arm AVG BP-- 117/66 Goal--3.8 L Hgb-- 11.5 yesterday  K+--4.3 yesterday

## 2011-10-03 NOTE — Progress Notes (Signed)
Patient ID: Jared [name], male   DOB: 08/08/1934, 78 y.o.   MRN: [id number] Stroke Team Progress Note  SUBJECTIVE  Patient seen in dialysis this morning. Overall mental status is improved. Says he is fine and wants to go home. Irritable but not agitated.  OBJECTIVE Most recent Vital Signs: Temp: 98.8 F (37.1 C) (02/23 0645) Temp src: Oral (02/23 0645) BP: 78/56 mmHg (02/23 0932) Pulse Rate: 69  (02/23 0932) Respiratory Rate: 20 O2 Saturation: 97%  CBG (last 3)  Basename 10/01/11 2132  GLUCAP 96   Intake/Output from previous day: 02/22 0701 - 02/23 0700 In: 230 [P.O.:230] Out: -   IV Fluid Intake:    Medications    . amLODipine  10 mg Oral Daily  . aspirin  81 mg Oral Daily  . calcium acetate  1,334 mg Oral TID WC  . Chlorhexidine Gluconate Cloth  6 each Topical Q0600  . cinacalcet  30 mg Oral Q breakfast  . clopidogrel  75 mg Oral Q breakfast  . darbepoetin (ARANESP) injection - DIALYSIS  200 mcg Intravenous Q Wed-HD  . famotidine  20 mg Oral Daily  . haloperidol lactate  1 mg Intravenous Once  . labetalol  300 mg Oral BID  . levothyroxine  88 mcg Oral Q0600  . LORazepam  1 mg Intramuscular Once  . multivitamin  1 tablet Oral QHS  . mupirocin ointment  1 application Nasal BID  . naproxen  250 mg Oral BID  . niacin  500 mg Oral Daily  . paricalcitol  8 mcg Intravenous Q T,Th,Sa-HD  . sodium chloride  3 mL Intravenous Q12H  . Travoprost (BAK Free)  1 drop Both Eyes BH-q7a  . DISCONTD: amLODipine  10 mg Oral Daily  . DISCONTD: metoprolol succinate  50 mg Oral Daily  . DISCONTD: paricalcitol  8 mcg Intravenous Q M,W,F-HD  PRN acetaminophen, acetaminophen, heparin, heparin, LORazepam, nitroGLYCERIN, ondansetron (ZOFRAN) IV, ondansetron  Studies: CBC    Component Value Date/Time   WBC 6.3 10/03/2011 0726   RBC 3.43* 10/03/2011 0726   HGB 11.4* 10/03/2011 0726   HCT 34.5* 10/03/2011 0726   PLT 258 10/03/2011 0726   MCV 100.6* 10/03/2011 0726   MCH 33.2 10/03/2011 0726    MCHC 33.0 10/03/2011 0726   RDW 13.6 10/03/2011 0726   LYMPHSABS 1.2 09/29/2011 1703   MONOABS 1.4* 09/29/2011 1703   EOSABS 0.2 09/29/2011 1703   BASOSABS 0.1 09/29/2011 1703   CMP    Component Value Date/Time   NA 140 10/03/2011 0850   K 4.8 10/03/2011 0850   CL 97 10/03/2011 0850   CO2 27 10/03/2011 0850   GLUCOSE 81 10/03/2011 0850   BUN 30* 10/03/2011 0850   CREATININE 10.05* 10/03/2011 0850   CALCIUM 9.2 10/03/2011 0850   PROT 6.9 09/30/2011 0610   ALBUMIN 3.3* 10/03/2011 0850   AST 15 09/30/2011 0610   ALT 14 09/30/2011 0610   ALKPHOS 75 09/30/2011 0610   BILITOT 0.4 09/30/2011 0610   GFRNONAA 4* 10/03/2011 0850   GFRAA 5* 10/03/2011 0850   COAGS Lab Results  Component Value Date   INR 1.01 11/11/2010   INR 1.19 09/12/2010   INR 1.16 09/11/2010   Lipid Panel    Component Value Date/Time   CHOL  Value: 173        ATP III CLASSIFICATION:  <200     mg/dL   Desirable  409-811  mg/dL   Borderline High  >=914    mg/dL  High 08/09/2008 0418   TRIG 70 08/09/2008 0418   HDL 49 08/09/2008 0418   CHOLHDL 3.5 08/09/2008 0418   VLDL 14 08/09/2008 0418   LDLCALC  Value: 110        Total Cholesterol/HDL:CHD Risk Coronary Heart Disease Risk Table                     Men   Women  1/2 Average Risk   3.4   3.3* 08/09/2008 0418   HgbA1C  No results found for this basename: HGBA1C   Urine Drug Screen  No results found for this basename: labopia, cocainscrnur, labbenz, amphetmu, thcu, labbarb    Alcohol Level    Component Value Date/Time   [facility] <11 09/29/2011 1703     Results for orders placed during the hospital encounter of 09/29/11 (from the past 24 hour(s))  RENAL FUNCTION PANEL     Status: Abnormal   Collection Time   10/02/11 10:45 AM      Component Value Range   Sodium 140  135 - 145 (mEq/L)   Potassium 4.3  3.5 - 5.1 (mEq/L)   Chloride 98  96 - 112 (mEq/L)   CO2 27  19 - 32 (mEq/L)   Glucose, Bld 120 (*) 70 - 99 (mg/dL)   BUN 18  6 - 23 (mg/dL)   Creatinine, Ser 1.61 (*) 0.50 - 1.35  (mg/dL)   Calcium 9.1  8.4 - 09.6 (mg/dL)   Phosphorus 2.9  2.3 - 4.6 (mg/dL)   Albumin 3.2 (*) 3.5 - 5.2 (g/dL)   GFR calc non Af Amer 6 (*) >90 (mL/min)   GFR calc Af Amer 7 (*) >90 (mL/min)  CBC     Status: Abnormal   Collection Time   10/02/11 10:45 AM      Component Value Range   WBC 6.8  4.0 - 10.5 (K/uL)   RBC 3.52 (*) 4.22 - 5.81 (MIL/uL)   Hemoglobin 11.5 (*) 13.0 - 17.0 (g/dL)   HCT 04.5 (*) 40.9 - 52.0 (%)   MCV 102.0 (*) 78.0 - 100.0 (fL)   MCH 32.7  26.0 - 34.0 (pg)   MCHC 32.0  30.0 - 36.0 (g/dL)   RDW 81.1  91.4 - 78.2 (%)   Platelets 238  150 - 400 (K/uL)  CBC     Status: Abnormal   Collection Time   10/03/11  7:26 AM      Component Value Range   WBC 6.3  4.0 - 10.5 (K/uL)   RBC 3.43 (*) 4.22 - 5.81 (MIL/uL)   Hemoglobin 11.4 (*) 13.0 - 17.0 (g/dL)   HCT 95.6 (*) 21.3 - 52.0 (%)   MCV 100.6 (*) 78.0 - 100.0 (fL)   MCH 33.2  26.0 - 34.0 (pg)   MCHC 33.0  30.0 - 36.0 (g/dL)   RDW 08.6  57.8 - 46.9 (%)   Platelets 258  150 - 400 (K/uL)  RENAL FUNCTION PANEL     Status: Abnormal   Collection Time   10/03/11  8:50 AM      Component Value Range   Sodium 140  135 - 145 (mEq/L)   Potassium 4.8  3.5 - 5.1 (mEq/L)   Chloride 97  96 - 112 (mEq/L)   CO2 27  19 - 32 (mEq/L)   Glucose, Bld 81  70 - 99 (mg/dL)   BUN 30 (*) 6 - 23 (mg/dL)   Creatinine, Ser 62.95 (*) 0.50 - 1.35 (mg/dL)   Calcium 9.2  8.4 - 10.5 (mg/dL)   Phosphorus 4.2  2.3 - 4.6 (mg/dL)   Albumin 3.3 (*) 3.5 - 5.2 (g/dL)   GFR calc non Af Amer 4 (*) >90 (mL/min)   GFR calc Af Amer 5 (*) >90 (mL/min)    Mr Brain Wo Contrast  10/03/2011  *RADIOLOGY REPORT*  Clinical Data: Altered mental status.  MRI HEAD WITHOUT CONTRAST  Technique:  Multiplanar, multiecho pulse sequences of the brain and surrounding structures were obtained according to standard protocol without intravenous contrast.  Comparison: CT head 09/29/2011  Findings: The patient had difficulty remaining motionless for the study.  Images are  suboptimal.  Small or subtle lesions could be overlooked.  Only axial diffusion, axial T2, and axial FLAIR sequences were obtained.  There is no visible acute stroke, intracranial hemorrhage, mass lesion, hydrocephalus, or extra-axial fluid.  There is severe atrophy with advanced chronic microvascular ischemic change.  Flow voids are maintained in the carotid and basilar arteries.  IMPRESSION: No visible acute stroke.  Severe atrophy with advanced chronic microvascular ischemic change.  Study limitations as described above.  Original Report Authenticated By: Elsie Stain, M.D.   Physical Exam   GENERAL:   Well nourished, well hydrated, no acute distress.   MENTAL STATUS EXAM:    Orientation:  Alert, answers questions appropriately.      Attention, concentration:  Attention span and concentration are normal.      Language:  Speech is clear and language is normal.    CRANIAL NERVES:     CN 3,4,6 (EOM):  Full eye movement without nystagmus.      CN 7 (Facial):  No facial weakness or asymmetry.      CN 8 (Auditory):  Auditory acuity grossly normal.   MOTOR:    Appears to have full and symmetric strength throughout.  COORDINATION:     Intact finger-to-nose and rapid alternating movements. No tremor.   SENSATION:     Intact to light touch.   GAIT:     Deferred due to HD.  ASSESSMENT Mr. Jared [name] is a 79 y.o. male presenting with AMS that is now resolving. MRI is negative for stroke. Suspect his AMS is metabolic over a baseline dementia. Age-advanced degree of atrophy on MRI would support a degree of baseline dementia.  Hospital day # 4  TREATMENT/PLAN No further neuro workup to recommend at this time. Will sign off but would be happy to reassess if desired.  Kipp Laurence, MD [facility] Pager: [phone] 10/03/2011 10:10 AM

## 2011-10-03 NOTE — Progress Notes (Signed)
Patient ID: Jared [name], male   DOB: Dec 26, 1933, 78 y.o.   MRN: [id number]  Subjective: No events overnight. Patient denies chest pain, shortness of breath, abdominal pain. Less agitated today.  Objective:  Vital signs in last 24 hours:  Filed Vitals:   10/03/11 1059 10/03/11 1125 10/03/11 1138 10/03/11 1219  BP: 106/59 108/57 113/63 105/68  Pulse:  72 74 73  Temp:   98.8 F (37.1 C) 97.5 F (36.4 C)  TempSrc:   Oral Oral  Resp:  14 24 20   Height:      Weight:   78.8 kg (173 lb 11.6 oz)   SpO2: 100% 100% 100% 96%    Intake/Output from previous day:   Intake/Output Summary (Last 24 hours) at 10/03/11 1453 Last data filed at 10/03/11 1138  Gross per 24 hour  Intake      0 ml  Output   1337 ml  Net  -1337 ml    Physical Exam: General: Alert, awake, oriented to name and place only, refusing to answer additional questions, in no acute distress. HEENT: No bruits, no goiter. Moist mucous membranes, no scleral icterus, no conjunctival pallor. Heart: Regular rate and rhythm, S1/S2 +, no murmurs, rubs, gallops. Lungs: Clear to auscultation bilaterally. No wheezing, no rhonchi, no rales.  Abdomen: Soft, nontender, nondistended, positive bowel sounds. Extremities: No clubbing or cyanosis, no pitting edema,  positive pedal pulses. Neuro: Grossly nonfocal.  Lab Results:  Basic Metabolic Panel:    Component Value Date/Time   NA 140 10/03/2011 0850   NA 140 10/03/2011 0850   K 4.8 10/03/2011 0850   K 5.0 10/03/2011 0850   CL 97 10/03/2011 0850   CL 95* 10/03/2011 0850   CO2 27 10/03/2011 0850   CO2 25 10/03/2011 0850   BUN 30* 10/03/2011 0850   BUN 32* 10/03/2011 0850   CREATININE 10.05* 10/03/2011 0850   CREATININE 10.15* 10/03/2011 0850   GLUCOSE 81 10/03/2011 0850   GLUCOSE 77 10/03/2011 0850   CALCIUM 9.2 10/03/2011 0850   CALCIUM 9.3 10/03/2011 0850   CBC:    Component Value Date/Time   WBC 6.3 10/03/2011 0726   HGB 11.4* 10/03/2011 0726   HCT 34.5* 10/03/2011 0726   PLT 258  10/03/2011 0726   MCV 100.6* 10/03/2011 0726   NEUTROABS 7.0 09/29/2011 1703   LYMPHSABS 1.2 09/29/2011 1703   MONOABS 1.4* 09/29/2011 1703   EOSABS 0.2 09/29/2011 1703   BASOSABS 0.1 09/29/2011 1703      Lab 10/03/11 0726 10/02/11 1045 10/01/11 0630 09/30/11 0610 09/29/11 1703  WBC 6.3 6.8 6.0 8.0 9.8  HGB 11.4* 11.5* 10.0* 8.5* 9.8*  HCT 34.5* 35.9* 30.6* 26.0* 29.4*  PLT 258 238 207 198 218  MCV 100.6* 102.0* 101.3* 99.6 100.0  MCH 33.2 32.7 33.1 32.6 33.3  MCHC 33.0 32.0 32.7 32.7 33.3  RDW 13.6 13.6 13.9 14.3 14.3  LYMPHSABS -- -- -- -- 1.2  MONOABS -- -- -- -- 1.4*  EOSABS -- -- -- -- 0.2  BASOSABS -- -- -- -- 0.1  BANDABS -- -- -- -- --    Lab 10/03/11 0850 10/02/11 1045 10/01/11 0630 09/30/11 0610 09/29/11 1703  NA 140140 140 141 141 140  K 4.85.0 4.3 3.8 5.3* 6.6*  CL 9795* 98 98 95* 94*  CO2 2725 27 29 24 25   GLUCOSE 8177 120* 131* 71 78  BUN 30*32* 18 32* 98* 91*  CREATININE 10.05*10.15* 7.45* 9.37* 19.15* 18.41*  CALCIUM 9.29.3 9.1 8.8 8.8 9.3  MG -- -- -- -- --    Recent Results (from the past 240 hour(s))  URINE CULTURE     Status: Normal   Collection Time   09/29/11  6:02 PM      Component Value Range Status Comment   Specimen Description URINE, CLEAN CATCH   Final    Special Requests NONE   Final    Culture  Setup Time [id number]   Final    Colony Count NO GROWTH   Final    Culture NO GROWTH   Final    Report Status 09/30/2011 FINAL   Final   MRSA PCR SCREENING     Status: Abnormal   Collection Time   09/30/11 11:34 AM      Component Value Range Status Comment   MRSA by PCR POSITIVE (*) NEGATIVE  Final     Studies/Results: Mr Brain Wo Contrast 10/03/2011   IMPRESSION: No visible acute stroke.  Severe atrophy with advanced chronic microvascular ischemic change.  Study limitations as described above.    Medications: Scheduled Meds:   . amLODipine  10 mg Oral Daily  . aspirin  81 mg Oral Daily  . calcium acetate  1,334 mg Oral TID WC  .  Chlorhexidine Gluconate Cloth  6 each Topical Q0600  . cinacalcet  30 mg Oral Q breakfast  . clopidogrel  75 mg Oral Q breakfast  . darbepoetin (ARANESP) injection - DIALYSIS  200 mcg Intravenous Q Wed-HD  . famotidine  20 mg Oral Daily  . haloperidol lactate  1 mg Intravenous Once  . labetalol  300 mg Oral BID  . levothyroxine  88 mcg Oral Q0600  . LORazepam  1 mg Intramuscular Once  . multivitamin  1 tablet Oral QHS  . mupirocin ointment  1 application Nasal BID  . naproxen  250 mg Oral BID  . niacin  500 mg Oral Daily  . paricalcitol  8 mcg Intravenous Q T,Th,Sa-HD  . sodium chloride  3 mL Intravenous Q12H  . Travoprost (BAK Free)  1 drop Both Eyes BH-q7a  . DISCONTD: paricalcitol  8 mcg Intravenous Q M,W,F-HD   Continuous Infusions:  PRN Meds:.acetaminophen, acetaminophen, heparin, heparin, LORazepam, nitroGLYCERIN, ondansetron (ZOFRAN) IV, ondansetron  Assessment/Plan:  Principal Problem:  *Altered mental status - unclear etiology at this time and I am not sure that it is metabolic in nature since current work up is not indicative of that - pt has chronic renal failure on HD but no metabolic acidosis or alkalosis noted - MRI is  not suggestive of stroke - I am questioning ? Psychiatric illness vs hospital acquired delirium - will place psych consult to determine if pt is able to make decisions  Active Problems:  ESRD (end stage renal disease) on dialysis - nephrology following   Hyperkalemia - resolved   ANEMIA, CHRONIC - remains stable - CBC in AM   HYPERTENSION - stable - continue vitals per floor protocol   EDUCATION - test results and diagnostic studies were discussed with patient  - unclear of pt comprehends the current medical problems, management   LOS: 4 days   MAGICK-Salik Grewell 10/03/2011, 2:53 PM  [facility] Pager: [phone]

## 2011-10-03 NOTE — Evaluation (Signed)
Physical Therapy Evaluation Patient Details Name: Jared [name] MRN: [id number] DOB: [birth year] Today's Date: 10/03/2011  Problem List:  Patient Active Problem List  Diagnoses  . HYPOTHYROIDISM  . ANEMIA, CHRONIC  . ERECTILE DYSFUNCTION  . HYPERTENSION  . CAD  . ESRD  . DEGENERATIVE JOINT DISEASE, GENERALIZED  . OSTEOARTHRITIS  . DYSPNEA  . POSITIVE PPD  . GOUT, HX OF  . DRUG ABUSE, HX OF  . Altered mental status  . ESRD (end stage renal disease) on dialysis  . Hyperkalemia    Past Medical History:  Past Medical History  Diagnosis Date  . Psychosexual dysfunction with inhibited sexual excitement   . Other specified personal history presenting hazards to health   . Generalized osteoarthrosis, unspecified site   . Osteoarthrosis, unspecified whether generalized or localized, unspecified site   . 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 Unspecified hypothyroidism   . Gout   . CAD (coronary artery disease)   . Unspecified deficiency anemia   . Unspecified essential hypertension   . End stage renal disease   . Myocardial infarction 1964  . Back pain    Past Surgical History:  Past Surgical History  Procedure Date  . Cabg     1994  . Av fistula placement   . Coronary artery bypass graft     PT Assessment/Plan/Recommendation PT Assessment Clinical Impression Statement: Pt adm with ams.  Unsure of pts home situation but at this time he will need 24 hour assist which probably will need to be SNF.  Less agitated today but still with significant cognitive issues. PT Recommendation/Assessment: Patient will need skilled PT in the acute care venue PT Problem List: Decreased strength;Decreased activity tolerance;Decreased mobility;Decreased balance;Decreased safety awareness;Decreased knowledge of use of DME;Decreased knowledge of precautions PT Therapy Diagnosis : Difficulty walking;Generalized weakness;Altered mental status PT Plan PT  Frequency: Min 3X/week PT Treatment/Interventions: DME instruction;Gait training;Functional mobility training;Balance training;Therapeutic exercise;Therapeutic activities;Cognitive remediation;Patient/family education PT Recommendation Follow Up Recommendations: Skilled nursing facility Equipment Recommended: Defer to next venue PT Goals  Acute Rehab PT Goals PT Goal Formulation: Patient unable to participate in goal setting Time For Goal Achievement: 2 weeks Pt will go Supine/Side to Sit: with modified independence PT Goal: Supine/Side to Sit - Progress: Goal set today Pt will go Sit to Supine/Side: with modified independence PT Goal: Sit to Supine/Side - Progress: Goal set today Pt will go Sit to Stand: with supervision PT Goal: Sit to Stand - Progress: Goal set today Pt will go Stand to Sit: with supervision PT Goal: Stand to Sit - Progress: Goal set today Pt will Ambulate: 51 - 150 feet;with least restrictive assistive device;with supervision PT Goal: Ambulate - Progress: Goal set today  PT Evaluation Precautions/Restrictions  Precautions Precautions: Fall Prior Functioning  Home Living Additional Comments: Unable to assess due to pt's coginition. Prior Function Comments: Unable to assess due to pt's mental status Cognition Cognition Arousal/Alertness: Awake/alert Overall Cognitive Status: Impaired Orientation Level: Oriented to person;Disoriented to place;Disoriented to time;Disoriented to situation Safety/Judgement: Decreased awareness of safety precautions Decreased Safety/Judgement: Decreased awareness of need for assistance Problem Solving: Requires assistance for problem solving Sensation/Coordination   Extremity Assessment RLE Strength RLE Overall Strength Comments: grossly 4/5 LLE Strength LLE Overall Strength Comments: grossly 4/5 Mobility (including Balance) Bed Mobility Supine to Sit: 3: Mod assist;HOB elevated (Comment degrees) (HOB 20 degrees) Supine to  Sit Details (indicate cue type and reason): assist to bring shoulders up Sitting - Scoot to [place] of  Bed: 4: Min assist Sit to Supine: 5: Supervision;HOB flat Transfers Sit to Stand: 1: +2 Total assist;Patient percentage (comment);With upper extremity assist;From bed;From toilet (pt=75%) Stand to Sit: 1: +2 Total assist;Patient percentage (comment);Without upper extremity assist;To bed;To toilet (pt=80%) Ambulation/Gait Ambulation/Gait Assistance: 1: +2 Total assist;Patient percentage (comment) (pt=75%) Ambulation/Gait Assistance Details (indicate cue type and reason): cues to stand upright Ambulation Distance (Feet): 25 Feet Assistive device: 2 person hand held assist Gait Pattern: Trunk flexed;Decreased step length - left;Decreased stance time - right  Static Standing Balance Static Standing - Balance Support: Bilateral upper extremity supported (with hand held) Static Standing - Level of Assistance: 1: +2 Total assist;Patient percentage (comment) (pt=90%) Exercise    End of Session PT - End of Session Activity Tolerance: Patient limited by fatigue;Other (comment) (pt self limiting) Patient left: in bed;with call bell in reach;with bed alarm set;Other (comment) (nurse present) Nurse Communication: Mobility status for ambulation  Orby Tangen 10/03/2011, 2:03 PM  [facility] PT [phone]

## 2011-10-04 DIAGNOSIS — F0391 Unspecified dementia with behavioral disturbance: Principal | ICD-10-CM

## 2011-10-04 MED ORDER — PRO-STAT SUGAR FREE PO LIQD: 30.0000 mL | Freq: Three times a day (TID) | ORAL | Status: DC

## 2011-10-04 MED ORDER — HALOPERIDOL LACTATE 5 MG/ML IJ SOLN
2.0000 mg | Freq: Two times a day (BID) | INTRAMUSCULAR | Status: DC | PRN
  Administered 2011-10-06: 2 mg via INTRAVENOUS
  Filled 2011-10-04 (×2): qty 1

## 2011-10-04 NOTE — Progress Notes (Signed)
Late Entry - At beginning of shift, patient was in left wrist restraint only with 4 rails up.  He is verbally and physically aggressive.  His bottom sheet is half off the bed.  He refuses to be adjusted in bed and sheets fixed.  He needs to be checked for possible incontinence which he refuses.  Attempts to calm patient are unsuccessful.  Security called to assist in putting patient in bilateral soft wrist restraints. Haldol 1mg  IV given, linens adjusted, checked for incontinence, four side rails put up, and bilateral wrist restraints put on.  NP contacted to make aware of situation and updated restraint order received.  Patient continues to be physically and verbally aggressive.  Will continue to monitor patient.  Stacie Glaze 10/04/2011

## 2011-10-04 NOTE — Consult Note (Signed)
Reason for Consult: Competency evaluation Referring Physician: Dr. [name] is an 78 y.o. male.  HPI: Patient was admitted to the [facility] cone renal unit with the end-stage renal disease, hypothyroidism, chronic anemia, hypertension, coronary artery disease and altered mental status. Psychiatric consult was requested for the capacity evaluation. Patient was the not cooperate, irritable easily getting annoyed, and refuse to treatment recommendations and services. Patient stated that he wanted to leave the hospital and go home without contacting the family members or following the medical team recommendations. I have spoken with the staff nurse caring for him who indicated me that he has been poorly cooperative with the staff, verbally and physically agitated and aggressive. Patient is demanding for snacks and shaving Kit during this visit. He has no understanding about his medical conditions and the required treatment needs.  Mental status examination: Patient appeared lying down on his bed with a 2 point restraints secondary to risk morning IV lines and kicking or hitting the staff members who approaches. Patient was irritable angry upset during this time. He denied to answer rest of the mental status questions quarries. He does not appear to responding to the internal stimuli but seems to be paranoid.  Past Medical History  Diagnosis Date  . Psychosexual dysfunction with inhibited sexual excitement   . Other specified personal history presenting hazards to health   . Generalized osteoarthrosis, unspecified site   . Osteoarthrosis, unspecified whether generalized or localized, unspecified site   . 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 Unspecified hypothyroidism   . Gout   . CAD (coronary artery disease)   . Unspecified deficiency anemia   . Unspecified essential hypertension   . End stage renal disease   . Myocardial infarction 1964  . Back pain      Past Surgical History  Procedure Date  . Cabg     1994  . Av fistula placement   . Coronary artery bypass graft     Family History  Problem Relation Age of Onset  . Heart disease Mother   . Heart disease Father   . Heart disease Sister     Social History:  reports that he quit smoking about 50 years ago. His smoking use included Cigarettes. He quit after 15 years of use. He has never used smokeless tobacco. He reports that he does not drink alcohol or use illicit drugs.  Allergies: No Known Allergies  Medications: I have reviewed the patient's current medications.  Results for orders placed during the hospital encounter of 09/29/11 (from the past 48 hour(s))  HEPATITIS B SURFACE ANTIGEN     Status: Normal   Collection Time   10/03/11  7:26 AM      Component Value Range Comment   Hepatitis B Surface Ag NEGATIVE  NEGATIVE    CBC     Status: Abnormal   Collection Time   10/03/11  7:26 AM      Component Value Range Comment   WBC 6.3  4.0 - 10.5 (K/uL)    RBC 3.43 (*) 4.22 - 5.81 (MIL/uL)    Hemoglobin 11.4 (*) 13.0 - 17.0 (g/dL)    HCT 16.1 (*) 09.6 - 52.0 (%)    MCV 100.6 (*) 78.0 - 100.0 (fL)    MCH 33.2  26.0 - 34.0 (pg)    MCHC 33.0  30.0 - 36.0 (g/dL)    RDW 04.5  40.9 - 81.1 (%)    Platelets 258  150 -  400 (K/uL)   RENAL FUNCTION PANEL     Status: Abnormal   Collection Time   10/03/11  8:50 AM      Component Value Range Comment   Sodium 140  135 - 145 (mEq/L)    Potassium 4.8  3.5 - 5.1 (mEq/L)    Chloride 97  96 - 112 (mEq/L)    CO2 27  19 - 32 (mEq/L)    Glucose, Bld 81  70 - 99 (mg/dL)    BUN 30 (*) 6 - 23 (mg/dL) DELTA CHECK NOTED   Creatinine, Ser 10.05 (*) 0.50 - 1.35 (mg/dL)    Calcium 9.2  8.4 - 10.5 (mg/dL)    Phosphorus 4.2  2.3 - 4.6 (mg/dL)    Albumin 3.3 (*) 3.5 - 5.2 (g/dL)    GFR calc non Af Amer 4 (*) >90 (mL/min)    GFR calc Af Amer 5 (*) >90 (mL/min)   COMPREHENSIVE METABOLIC PANEL     Status: Abnormal   Collection Time   10/03/11  8:50  AM      Component Value Range Comment   Sodium 140  135 - 145 (mEq/L)    Potassium 5.0  3.5 - 5.1 (mEq/L)    Chloride 95 (*) 96 - 112 (mEq/L)    CO2 25  19 - 32 (mEq/L)    Glucose, Bld 77  70 - 99 (mg/dL)    BUN 32 (*) 6 - 23 (mg/dL)    Creatinine, Ser 96.04 (*) 0.50 - 1.35 (mg/dL)    Calcium 9.3  8.4 - 10.5 (mg/dL)    Total Protein 7.7  6.0 - 8.3 (g/dL)    Albumin 3.3 (*) 3.5 - 5.2 (g/dL)    AST 17  0 - 37 (U/L)    ALT 15  0 - 53 (U/L)    Alkaline Phosphatase 85  39 - 117 (U/L)    Total Bilirubin 0.3  0.3 - 1.2 (mg/dL)    GFR calc non Af Amer 4 (*) >90 (mL/min)    GFR calc Af Amer 5 (*) >90 (mL/min)   PHOSPHORUS     Status: Normal   Collection Time   10/03/11  8:50 AM      Component Value Range Comment   Phosphorus 4.3  2.3 - 4.6 (mg/dL)     Mr Brain Wo Contrast  10/03/2011  *RADIOLOGY REPORT*  Clinical Data: Altered mental status.  MRI HEAD WITHOUT CONTRAST  Technique:  Multiplanar, multiecho pulse sequences of the brain and surrounding structures were obtained according to standard protocol without intravenous contrast.  Comparison: CT head 09/29/2011  Findings: The patient had difficulty remaining motionless for the study.  Images are suboptimal.  Small or subtle lesions could be overlooked.  Only axial diffusion, axial T2, and axial FLAIR sequences were obtained.  There is no visible acute stroke, intracranial hemorrhage, mass lesion, hydrocephalus, or extra-axial fluid.  There is severe atrophy with advanced chronic microvascular ischemic change.  Flow voids are maintained in the carotid and basilar arteries.  IMPRESSION: No visible acute stroke.  Severe atrophy with advanced chronic microvascular ischemic change.  Study limitations as described above.  Original Report Authenticated By: Elsie Stain, M.D.    ROS Blood pressure 148/82, pulse 75, temperature 97 F (36.1 C), temperature source Axillary, resp. rate 20, height 5\' 5"  (1.651 m), weight 78.1 kg (172 lb 2.9 oz), SpO2  98.00%. Physical Exam  Assessment/Plan: Based on my assessment patient does not meet criteria for the competency. Please contact social  worker regarding appropriate consent requirements. Please contact his the niece who seems to be supportive of patient. Recommend Haldol 2 mg every 8 hours as needed for agitation and aggressive behavior. Thank you for the psychiatric consultation on this patient.   Jared [name],JANARDHAHA R. 10/04/2011, 5:59 PM

## 2011-10-04 NOTE — Progress Notes (Signed)
[facility] Progress Note Subjective:  "Get your hand off me.  You guys don't know shit." Wants breakfast. (tray had just been brought in and pt unable to access)  Objective - no VS for 2/24 Filed Vitals:   10/03/11 1138 10/03/11 1219 10/03/11 1737 10/03/11 2043  BP: 113/63 105/68 115/59 136/77  Pulse: 74 73 79 74  Temp: 98.8 F (37.1 C) 97.5 F (36.4 C) 99 F (37.2 C) 97.9 F (36.6 C)  TempSrc: Oral Oral Oral Oral  Resp: 24 20 20 20   Height:      Weight: 78.8 kg (173 lb 11.6 oz)   78.1 kg (172 lb 2.9 oz)  SpO2: 100% 96% 98% 97%   Physical Exam: deferred as pt started yelling and cursing when I put my hand on his shoulder General: in restraints in video monitored room Heart: Lungs: Abdomen: Extremities: noted BP cuff on right forearm (has right upper AVF- did not examine for patency) Dialysis Access:   Problem/Plan: 1. AMS -intermittent episodes of agitation and combativeness last evening; I do not think this is metabolic pertaining to ESRD. ? Delirium superimposed on dementia .  Agree with psychiatric consult. Head MR yesterday - no acute change; atrophy with advanced chronic microvascular disease. ? Check B12. History of drug abuse and likely using crack or cocaine at least intermittently ? Alcohol even though nontoxic level on admission 2. ESRD -TTS HD yesterday. Net UF 1.3 ; Wt 78.1 post HD below outpt EDW of 85; Do not need to lower further 3. Anemia - Hgb variable; Epo 2200 outpt changed to 200 Aranesp 2/20; no need for Fe  4. Secondary hyperparathyroidism - well controlled; continue Zemplar 8 and phoslo 2 and sensipar 30  5. HTN/volume - BP well controlled, almost low for him.; on labetalol 300 bid; Nursing staff instructed again not to place BP cuff on either arm.  Need to do leg BPs 6. Nutrition - increase to 80/90 protein renal diet; add supplements; albumin 3.2; Nursing notified to allow patient to eat with supervison  7. Hypothyroidism - TSH low at 0.297-  defer to primary   Additional Objective Labs: Basic Metabolic Panel:  Lab 10/03/11 6295 10/02/11 1045 10/01/11 0630  NA 140140 140 141  K 4.85.0 4.3 3.8  CL 9795* 98 98  CO2 2725 27 29  GLUCOSE 8177 120* 131*  BUN 30*32* 18 32*  CREATININE 10.05*10.15* 7.45* 9.37*  CALCIUM 9.29.3 9.1 8.8  ALB -- -- --  PHOS 4.24.3 2.9 4.9*   Liver Function Tests:  Lab 10/03/11 0850 10/02/11 1045 10/01/11 0630 09/30/11 0610 09/29/11 1703  AST 17 -- -- 15 23  ALT 15 -- -- 14 15  ALKPHOS 85 -- -- 75 87  BILITOT 0.3 -- -- 0.4 0.3  PROT 7.7 -- -- 6.9 7.7  ALBUMIN 3.3*3.3* 3.2* 3.2* -- --   Lab 09/29/11 1715  AMMONIA 18   INR: @resultsinr3 @ CBC:  Lab 10/03/11 0726 10/02/11 1045 10/01/11 0630 09/30/11 0610 09/29/11 1703  WBC 6.3 6.8 6.0 -- --  NEUTROABS -- -- -- -- 7.0  HGB 11.4* 11.5* 10.0* -- --  HCT 34.5* 35.9* 30.6* -- --  MCV 100.6* 102.0* 101.3* 99.6 100.0  PLT 258 238 207 -- --   Urine Culture    Component Value Date/Time   SDES URINE, CLEAN CATCH 09/29/2011 1802   SPECREQUEST NONE 09/29/2011 1802   CULT NO GROWTH 09/29/2011 1802   REPTSTATUS 09/30/2011 FINAL 09/29/2011 1802   CBG:  Lab 10/01/11 2132  GLUCAP 96  Studies/Results: Mr Brain Wo Contrast  10/03/2011    IMPRESSION: No visible acute stroke.  Severe atrophy with advanced chronic microvascular ischemic change.  Study limitations as described above.  Original Report Authenticated By: Elsie Stain, M.D.   Medications:      . amLODipine  10 mg Oral Daily  . aspirin  81 mg Oral Daily  . calcium acetate  1,334 mg Oral TID WC  . Chlorhexidine Gluconate Cloth  6 each Topical Q0600  . cinacalcet  30 mg Oral Q breakfast  . clopidogrel  75 mg Oral Q breakfast  . darbepoetin (ARANESP) injection - DIALYSIS  200 mcg Intravenous Q Wed-HD  . famotidine  20 mg Oral Daily  . labetalol  300 mg Oral BID  . levothyroxine  88 mcg Oral Q0600  . LORazepam  1 mg Intramuscular Once  . multivitamin  1 tablet Oral QHS    . mupirocin ointment  1 application Nasal BID  . niacin  500 mg Oral Daily  . paricalcitol  8 mcg Intravenous Q T,Th,Sa-HD  . sodium chloride  3 mL Intravenous Q12H  . Travoprost (BAK Free)  1 drop Both Eyes BH-q7a  . DISCONTD: naproxen  250 mg Oral BID  . DISCONTD: paricalcitol  8 mcg Intravenous Q M,W,F-HD    I  have reviewed scheduled and prn medications.  Sheffield Slider, PA-C [facility] Beeper [phone]  10/04/2011,8:17 AM  LOS: 5 days   Still belligerent and has restraints on both hands.  Agree that psych consult indicated.  Dialyzed yesterday. Agree with plans

## 2011-10-04 NOTE — Progress Notes (Signed)
Patient ID: Jared [name], male   DOB: 1934-07-12, 78 y.o.   MRN: [id number]  Subjective: No events overnight. Patient more agitated and refuses to talk. Objective:  Vital signs in last 24 hours:  Filed Vitals:   10/03/11 2043 10/04/11 0931 10/04/11 1333 10/04/11 1810  BP: 136/77 175/87 148/82 144/64  Pulse: 74  75 74  Temp:   97 F (36.1 C) 97.7 F (36.5 C)  TempSrc: Oral  Axillary Oral  Resp: 20 22 20 20   Height:      Weight: 78.1 kg (172 lb 2.9 oz)     SpO2: 97%  98% 96%    Intake/Output from previous day:   Intake/Output Summary (Last 24 hours) at 10/04/11 2030 Last data filed at 10/04/11 1812  Gross per 24 hour  Intake   1080 ml  Output     50 ml  Net   1030 ml    Physical Exam: General: Alert, awake, refuses examination today  Lab Results:  Basic Metabolic Panel:    Component Value Date/Time   NA 140 10/03/2011 0850   NA 140 10/03/2011 0850   K 4.8 10/03/2011 0850   K 5.0 10/03/2011 0850   CL 97 10/03/2011 0850   CL 95* 10/03/2011 0850   CO2 27 10/03/2011 0850   CO2 25 10/03/2011 0850   BUN 30* 10/03/2011 0850   BUN 32* 10/03/2011 0850   CREATININE 10.05* 10/03/2011 0850   CREATININE 10.15* 10/03/2011 0850   GLUCOSE 81 10/03/2011 0850   GLUCOSE 77 10/03/2011 0850   CALCIUM 9.2 10/03/2011 0850   CALCIUM 9.3 10/03/2011 0850   CBC:    Component Value Date/Time   WBC 6.3 10/03/2011 0726   HGB 11.4* 10/03/2011 0726   HCT 34.5* 10/03/2011 0726   PLT 258 10/03/2011 0726   MCV 100.6* 10/03/2011 0726   NEUTROABS 7.0 09/29/2011 1703   LYMPHSABS 1.2 09/29/2011 1703   MONOABS 1.4* 09/29/2011 1703   EOSABS 0.2 09/29/2011 1703   BASOSABS 0.1 09/29/2011 1703      Lab 10/03/11 0726 10/02/11 1045 10/01/11 0630 09/30/11 0610 09/29/11 1703  WBC 6.3 6.8 6.0 8.0 9.8  HGB 11.4* 11.5* 10.0* 8.5* 9.8*  HCT 34.5* 35.9* 30.6* 26.0* 29.4*  PLT 258 238 207 198 218  MCV 100.6* 102.0* 101.3* 99.6 100.0  MCH 33.2 32.7 33.1 32.6 33.3  MCHC 33.0 32.0 32.7 32.7 33.3  RDW 13.6 13.6 13.9  14.3 14.3  LYMPHSABS -- -- -- -- 1.2  MONOABS -- -- -- -- 1.4*  EOSABS -- -- -- -- 0.2  BASOSABS -- -- -- -- 0.1  BANDABS -- -- -- -- --    Lab 10/03/11 0850 10/02/11 1045 10/01/11 0630 09/30/11 0610 09/29/11 1703  NA 140140 140 141 141 140  K 4.85.0 4.3 3.8 5.3* 6.6*  CL 9795* 98 98 95* 94*  CO2 2725 27 29 24 25   GLUCOSE 8177 120* 131* 71 78  BUN 30*32* 18 32* 98* 91*  CREATININE 10.05*10.15* 7.45* 9.37* 19.15* 18.41*  CALCIUM 9.29.3 9.1 8.8 8.8 9.3  MG -- -- -- -- --   No results found for this basename: INR:5,PROTIME:5 in the last 168 hours Cardiac markers: No results found for this basename: CK:3,CKMB:3,TROPONINI:3,MYOGLOBIN:3 in the last 168 hours No components found with this basename: POCBNP:3 Recent Results (from the past 240 hour(s))  URINE CULTURE     Status: Normal   Collection Time   09/29/11  6:02 PM      Component Value Range Status  Comment   Specimen Description URINE, CLEAN CATCH   Final    Special Requests NONE   Final    Culture  Setup Time [id number]   Final    Colony Count NO GROWTH   Final    Culture NO GROWTH   Final    Report Status 09/30/2011 FINAL   Final   MRSA PCR SCREENING     Status: Abnormal   Collection Time   09/30/11 11:34 AM      Component Value Range Status Comment   MRSA by PCR POSITIVE (*) NEGATIVE  Final     Studies/Results: No results found.  Medications: Scheduled Meds:   . amLODipine  10 mg Oral Daily  . aspirin  81 mg Oral Daily  . calcium acetate  1,334 mg Oral TID WC  . Chlorhexidine Gluconate Cloth  6 each Topical Q0600  . cinacalcet  30 mg Oral Q breakfast  . clopidogrel  75 mg Oral Q breakfast  . darbepoetin (ARANESP) injection - DIALYSIS  200 mcg Intravenous Q Wed-HD  . famotidine  20 mg Oral Daily  . labetalol  300 mg Oral BID  . levothyroxine  88 mcg Oral Q0600  . LORazepam  1 mg Intramuscular Once  . multivitamin  1 tablet Oral QHS  . mupirocin ointment  1 application Nasal BID  . niacin  500 mg Oral  Daily  . paricalcitol  8 mcg Intravenous Q T,Th,Sa-HD  . sodium chloride  3 mL Intravenous Q12H  . Travoprost (BAK Free)  1 drop Both Eyes BH-q7a  . DISCONTD: feeding supplement  30 mL Oral TID WC   Continuous Infusions:  PRN Meds:.acetaminophen, acetaminophen, haloperidol lactate, heparin, heparin, LORazepam, nitroGLYCERIN, ondansetron (ZOFRAN) IV, ondansetron, DISCONTD: haloperidol lactate  Assessment/Plan: Principal Problem:  *Altered mental status  - unclear etiology at this time and I am not sure that it is metabolic in nature since current work up is not indicative of that  - pt has chronic renal failure on HD but no metabolic acidosis or alkalosis noted  - MRI is not suggestive of stroke  - I am questioning ? Psychiatric illness vs hospital acquired delirium  - appreciate psych consult - will attempt calling POA  Active Problems:  ESRD (end stage renal disease) on dialysis  - nephrology following   Hyperkalemia  - resolved   ANEMIA, CHRONIC  - remains stable  - CBC in AM   HYPERTENSION  - stable  - continue vitals per floor protocol    LOS: 5 days   MAGICK-Carrissa Taitano 10/04/2011, 8:30 PM  [facility] Pager: [phone]

## 2011-10-05 MED ORDER — HEPARIN SODIUM (PORCINE) 1000 UNIT/ML DIALYSIS
20.0000 [IU]/kg | INTRAMUSCULAR | Status: DC | PRN
  Administered 2011-10-06: 1600 [IU] via INTRAVENOUS_CENTRAL
  Filled 2011-10-05: qty 2

## 2011-10-05 NOTE — Progress Notes (Addendum)
[facility] Progress Note  Subjective:  "I need a shave. You don't need to listen to my heart. Those clowns have been doing it all day."  Objective Filed Vitals:   10/04/11 0931 10/04/11 1333 10/04/11 1810 10/04/11 2148  BP: 175/87 148/82 144/64 145/70  Pulse:  75 74 71  Temp:  97 F (36.1 C) 97.7 F (36.5 C) 99.7 F (37.6 C)  TempSrc:  Axillary Oral Oral  Resp: 22 20 20 19   Height:    5\' 5"  (1.651 m)  Weight:    79.2 kg (174 lb 9.7 oz)  SpO2:  98% 96% 96%   Physical Exam: Deferred due to billigerence General: Unshaven, resting quietly until I asked him if I could listen to his heart, then became verbally abuse and angry; still in hand restraints..   Problem/Plan: 1. AMS -intermittent episodes of agitation and combativeness last evening; I do not think this is metabolic pertaining to ESRD. ? Delirium superimposed on dementia . Pt can be quick to anger at baseline at outpatient dialysis when he doesn't get what he wants or the staff doesn't do what he wants.  Deemed not to have capacity by psych--see consult; on prn haldol, but affect same today as yesterday. 2. ESRD -TTS HD orders written; remarkably calm on HD Saturday. 3. Anemia - Hgb variable; Epo 2200 outpt changed to 200 Aranesp 2/20; no need for Fe; check pre HD  4. Secondary hyperparathyroidism - well controlled; continue Zemplar 8 and phoslo 2 and sensipar 30  5. HTN/volume - BP well controlled, almost low for him.; on labetalol 300 bid; Nursing staff instructed again not to place BP cuff on either arm. Need to do leg BPs; BELOW outpt EDW of 85- minimal intake records of fluids; tends to large fluid gains as an outpt. 6. Nutrition - increase to 80/90 protein renal diet; add supplements; ate eggs and  Drank juice off of breakfast tray today.7. Hypothyroidism - TSH low at 0.297- defer to primary  7. Hypothyroidism - TSH low at 0.297- defer to primary  No new Labs  Medications:      . amLODipine  10 mg Oral  Daily  . aspirin  81 mg Oral Daily  . calcium acetate  1,334 mg Oral TID WC  . Chlorhexidine Gluconate Cloth  6 each Topical Q0600  . cinacalcet  30 mg Oral Q breakfast  . clopidogrel  75 mg Oral Q breakfast  . darbepoetin (ARANESP) injection - DIALYSIS  200 mcg Intravenous Q Wed-HD  . famotidine  20 mg Oral Daily  . labetalol  300 mg Oral BID  . levothyroxine  88 mcg Oral Q0600  . LORazepam  1 mg Intramuscular Once  . multivitamin  1 tablet Oral QHS  . mupirocin ointment  1 application Nasal BID  . niacin  500 mg Oral Daily  . paricalcitol  8 mcg Intravenous Q T,Th,Sa-HD  . sodium chloride  3 mL Intravenous Q12H  . Travoprost (BAK Free)  1 drop Both Eyes BH-q7a  . DISCONTD: feeding supplement  30 mL Oral TID WC    I  have reviewed scheduled and prn medications.  Sheffield Slider, PA-C [facility] Beeper [phone]  10/05/2011,9:38 AM  LOS: 6 days   Patient seen and examined and agree with assessment and plan as above.  Do not see any evidence of disorientation today, pt upset about losing money he came to hospital with.  Recommendations as above. Doubt any renal/metabolic issues are causative of primary  problem.   Jared Moselle  MD [employer] [phone] pgr    [phone] cell 10/05/2011, 2:04 PM

## 2011-10-05 NOTE — Progress Notes (Signed)
Pt became agitated,verbally abusive,and physically abusive to staff,pt kept getting out of bed,security called to place pt back in bil restraits,pt not following simple commands,pt high risk fall.

## 2011-10-05 NOTE — Progress Notes (Signed)
Clinical Social Work Psychiatry  Assessment and Follow up    Presenting Symptoms/Problems:   AMS -intermittent episodes of agitation and combativeness last evening; I do not think this is metabolic pertaining to ESRD. ? Delirium superimposed on dementia . Pt can be quick to anger at baseline at outpatient dialysis when he doesn't get what he wants or the staff doesn't do what he wants. Deemed not to have capacity by psych--see consult; on prn haldol, but affect same today as yesterday. Pt became agitated,verbally abusive,and physically abusive to staff,pt kept getting out of bed,security called to place pt back in bil restraits,pt not following simple commands,pt high risk fall.    Per staff patient has been verbally and physically abusive, however it seems to be a baseline for patient and how he responds to women.  Psychiatric History:  No previous psych history per report of patient or review of medical record/EPIC.  Patient is very verbally and has been physically aggressive, use to getting what he wants and acts out when he does not get what he wants.  Currently in restraints due to agitation and acting out physically.  NO SI, HI, or psychosis noted.    Family Collateral Information: Patient wife and daughter live in [place] and patient has a relative/niece lives in [place] but is not HCPOA nor able to make decisions at this time.  Patient reports he lives alone in a room/rents a room and uses Medicaid transportation to get to Dialysis appointments in [place]. Spoke with patient's niece who  Reports she is working to get more information about his family in [place] and contact numbers.  Reports when patient is doing well and not sick he is very calm, cooperative and a gentle man who tries to help others, takes care of himself, compliant with medication and treatments/appointments.  When patient is sick he is confused and very agitated, aggressive, and mean.  Reports when he gets sick he  is not himself and the way he is acting and behaviors leads his niece to feel he is not well.  Reports he typically gets well and will become his normal self which is the complete opposite as to how he is presenting today.                            Emotional Health/Current Symptoms:       Attention/Behavioral/Psychotic Symptoms: patient lying in bed in bilateral restraints on arms.  Patient glares at CSW when asked questions and asked to participate and patient reports "no I will not".  CSW explained her role and consequences of not participating.  Patient reports fine he will answer questions.  Patient is lucid in thought but alert and oriented to self and place, but not able to tell CSW how long he has been in the hospital and reports "dumb black broads don't know what they are doing and will not shave him and giving him the wrong medication".  Patient at this time does not have capacity to make decisions due to agitation and ?confusion.  However, patient able to tell me where he lives, his dialysis schedule Tu/Thu/Sat and how he gets to appointments: medicaid transport.  Patient however not able to tell me how long he has been here, reason for admission and is very defiant and guarded.  He is not cooperative, but feel this is his baseline and how he treats providers when he does not get what he wants.  Interpretive Summary/Anticipated DC Plan:   1.  Patient lives alone in a room that he rents, but ? If patient able to return home.  Natural supports live far in [place] and niece seems to be only support.  PT recommending SNF at dc and this may be his safest discharge plan if patient remains in restraints and continues to refuse his medications causing medical problems. 2. Confusion vs Dementia vs Delirium with patient behaviors and memory.  Will discuss with psych MD with regards to capacity.  At this time patient does not have capacity. 3.  No previous psych history on file that CSW is aware. 4.   Niece will contact CSW with contact information from family in [place] who has more information and can consent to SNF if needed and if patient does not clear from confusion.  Reports if patient does not clear and is still sick and confused, then is open to ST rehab if he cannot return home.  Would like to see if patient gets better before talking about SNF.  If patient clears and behaviors improve would like patient to return home like he typically does.  No previous psych history. Will follow and discuss with Psych MD.  Ashley Jacobs, MSW LCSW [phone]

## 2011-10-05 NOTE — Progress Notes (Signed)
Patient ID: Jared [name], male   DOB: 21-Jun-1934, 78 y.o.   MRN: [id number]  Subjective: No events overnight.  Objective:  Vital signs in last 24 hours:  Filed Vitals:   10/04/11 1333 10/04/11 1810 10/04/11 2148 10/05/11 1400  BP: 148/82 144/64 145/70 135/68  Pulse: 75 74 71 72  Temp: 97 F (36.1 C) 97.7 F (36.5 C) 99.7 F (37.6 C) 98.4 F (36.9 C)  TempSrc: Axillary Oral Oral Oral  Resp: 20 20 19 20   Height:   5\' 5"  (1.651 m)   Weight:   79.2 kg (174 lb 9.7 oz)   SpO2: 98% 96% 96% 100%    Intake/Output from previous day:   Intake/Output Summary (Last 24 hours) at 10/05/11 2157 Last data filed at 10/05/11 0900  Gross per 24 hour  Intake    240 ml  Output    100 ml  Net    140 ml    Physical Exam: Pt refuses physical exam.  Lab Results:  Basic Metabolic Panel:    Component Value Date/Time   NA 140 10/03/2011 0850   NA 140 10/03/2011 0850   K 4.8 10/03/2011 0850   K 5.0 10/03/2011 0850   CL 97 10/03/2011 0850   CL 95* 10/03/2011 0850   CO2 27 10/03/2011 0850   CO2 25 10/03/2011 0850   BUN 30* 10/03/2011 0850   BUN 32* 10/03/2011 0850   CREATININE 10.05* 10/03/2011 0850   CREATININE 10.15* 10/03/2011 0850   GLUCOSE 81 10/03/2011 0850   GLUCOSE 77 10/03/2011 0850   CALCIUM 9.2 10/03/2011 0850   CALCIUM 9.3 10/03/2011 0850   CBC:    Component Value Date/Time   WBC 6.3 10/03/2011 0726   HGB 11.4* 10/03/2011 0726   HCT 34.5* 10/03/2011 0726   PLT 258 10/03/2011 0726   MCV 100.6* 10/03/2011 0726   NEUTROABS 7.0 09/29/2011 1703   LYMPHSABS 1.2 09/29/2011 1703   MONOABS 1.4* 09/29/2011 1703   EOSABS 0.2 09/29/2011 1703   BASOSABS 0.1 09/29/2011 1703      Lab 10/03/11 0726 10/02/11 1045 10/01/11 0630 09/30/11 0610 09/29/11 1703  WBC 6.3 6.8 6.0 8.0 9.8  HGB 11.4* 11.5* 10.0* 8.5* 9.8*  HCT 34.5* 35.9* 30.6* 26.0* 29.4*  PLT 258 238 207 198 218  MCV 100.6* 102.0* 101.3* 99.6 100.0  MCH 33.2 32.7 33.1 32.6 33.3  MCHC 33.0 32.0 32.7 32.7 33.3  RDW 13.6 13.6 13.9 14.3  14.3  LYMPHSABS -- -- -- -- 1.2  MONOABS -- -- -- -- 1.4*  EOSABS -- -- -- -- 0.2  BASOSABS -- -- -- -- 0.1  BANDABS -- -- -- -- --    Lab 10/03/11 0850 10/02/11 1045 10/01/11 0630 09/30/11 0610 09/29/11 1703  NA 140140 140 141 141 140  K 4.85.0 4.3 3.8 5.3* 6.6*  CL 9795* 98 98 95* 94*  CO2 2725 27 29 24 25   GLUCOSE 8177 120* 131* 71 78  BUN 30*32* 18 32* 98* 91*  CREATININE 10.05*10.15* 7.45* 9.37* 19.15* 18.41*  CALCIUM 9.29.3 9.1 8.8 8.8 9.3  MG -- -- -- -- --   No results found for this basename: INR:5,PROTIME:5 in the last 168 hours Cardiac markers: No results found for this basename: CK:3,CKMB:3,TROPONINI:3,MYOGLOBIN:3 in the last 168 hours No components found with this basename: POCBNP:3 Recent Results (from the past 240 hour(s))  URINE CULTURE     Status: Normal   Collection Time   09/29/11  6:02 PM      Component Value  Range Status Comment   Specimen Description URINE, CLEAN CATCH   Final    Special Requests NONE   Final    Culture  Setup Time [id number]   Final    Colony Count NO GROWTH   Final    Culture NO GROWTH   Final    Report Status 09/30/2011 FINAL   Final   MRSA PCR SCREENING     Status: Abnormal   Collection Time   09/30/11 11:34 AM      Component Value Range Status Comment   MRSA by PCR POSITIVE (*) NEGATIVE  Final     Studies/Results: No results found.  Medications: Scheduled Meds:   . amLODipine  10 mg Oral Daily  . aspirin  81 mg Oral Daily  . calcium acetate  1,334 mg Oral TID WC  . Chlorhexidine Gluconate Cloth  6 each Topical Q0600  . cinacalcet  30 mg Oral Q breakfast  . clopidogrel  75 mg Oral Q breakfast  . darbepoetin (ARANESP) injection - DIALYSIS  200 mcg Intravenous Q Wed-HD  . famotidine  20 mg Oral Daily  . labetalol  300 mg Oral BID  . levothyroxine  88 mcg Oral Q0600  . LORazepam  1 mg Intramuscular Once  . multivitamin  1 tablet Oral QHS  . mupirocin ointment  1 application Nasal BID  . niacin  500 mg Oral  Daily  . paricalcitol  8 mcg Intravenous Q T,Th,Sa-HD  . sodium chloride  3 mL Intravenous Q12H  . Travoprost (BAK Free)  1 drop Both Eyes BH-q7a   Continuous Infusions:  PRN Meds:.acetaminophen, acetaminophen, haloperidol lactate, heparin, LORazepam, nitroGLYCERIN, ondansetron (ZOFRAN) IV, ondansetron, DISCONTD: heparin, DISCONTD: heparin  Assessment/Plan:  Principal Problem:  *Altered mental status  - unclear etiology at this time and I am not sure that it is metabolic in nature since current work up is not indicative of that  - pt has chronic renal failure on HD but no metabolic acidosis or alkalosis noted  - MRI is not suggestive of stroke  - I am questioning ? Psychiatric illness vs hospital acquired delirium  - appreciate psych consult  - will attempt calling POA, daughter in [place]  Active Problems:  ESRD (end stage renal disease) on dialysis  - nephrology following   Hyperkalemia  - resolved   ANEMIA, CHRONIC  - remains stable  - CBC in AM   HYPERTENSION  - stable  - continue vitals per floor protocol     LOS: 6 days   Jared [name] 10/05/2011, 9:57 PM  [facility] Pager: [phone]

## 2011-10-06 ENCOUNTER — Inpatient Hospital Stay (HOSPITAL_COMMUNITY): Payer: Medicare Other

## 2011-10-06 LAB — CBC
HCT: 32.3 % — ABNORMAL LOW (ref 39.0–52.0)
Hemoglobin: 10.9 g/dL — ABNORMAL LOW (ref 13.0–17.0)
MCHC: 33.7 g/dL (ref 30.0–36.0)
RBC: 3.32 MIL/uL — ABNORMAL LOW (ref 4.22–5.81)

## 2011-10-06 LAB — BASIC METABOLIC PANEL
BUN: 71 mg/dL — ABNORMAL HIGH (ref 6–23)
CO2: 25 mEq/L (ref 19–32)
Chloride: 93 mEq/L — ABNORMAL LOW (ref 96–112)
GFR calc non Af Amer: 3 mL/min — ABNORMAL LOW (ref >90)
Glucose, Bld: 94 mg/dL (ref 70–99)
Potassium: 5.3 mEq/L — ABNORMAL HIGH (ref 3.5–5.1)
Sodium: 136 mEq/L (ref 135–145)

## 2011-10-06 MED ORDER — QUETIAPINE FUMARATE 50 MG PO TABS
50.0000 mg | ORAL_TABLET | Freq: Every day | ORAL | Status: DC
  Administered 2011-10-06: 50 mg via ORAL
  Filled 2011-10-06 (×4): qty 1

## 2011-10-06 MED ORDER — LORAZEPAM 2 MG/ML IJ SOLN: 1.0000 mg | Freq: Two times a day (BID) | INTRAMUSCULAR | Status: DC | PRN

## 2011-10-06 MED ORDER — PARICALCITOL 5 MCG/ML IV SOLN
INTRAVENOUS | Status: AC
  Administered 2011-10-06: 8 ug via INTRAVENOUS_CENTRAL
  Filled 2011-10-06: qty 2

## 2011-10-06 MED ORDER — HALOPERIDOL LACTATE 5 MG/ML IJ SOLN
2.0000 mg | Freq: Three times a day (TID) | INTRAMUSCULAR | Status: AC | PRN
  Administered 2011-10-07: 2 mg via INTRAMUSCULAR

## 2011-10-06 NOTE — Progress Notes (Signed)
[facility] Progress Note Subjective:  At the [facility].  Objective Filed Vitals:   10/06/11 1030 10/06/11 1055 10/06/11 1113 10/06/11 1145  BP: 126/72 120/76 115/68 97/65  Pulse: 72 71 71 71  Temp:      TempSrc:      Resp: 26 26 26 24   Height:      Weight:      SpO2:       Physical Exam: General appearance: restrained on HD, calm sort of glazed look in eyes; answers a few questions Resp: grossly CTA  bilaterally  Cardio: RRR without murmur  GI: + BS, soft and nontender  Extremities: No edema  Access: AVG @ LUA Qb 400, AVF @ RUA placed 04/15/11 (ready to use)  Problem/Plan:  1. AMS -calmer on HD; would try to get out of restraints 2. ESRD -TTS HD orders written; remarkably calm on HD Saturday.  3. Anemia - Hgb 10.9; Epo 2200 outpt changed to 200 Aranesp 2/20; no need for Fe; check pre HD  4. Secondary hyperparathyroidism - well controlled; continue Zemplar 8 and phoslo 2 and sensipar 30  5. HTN/volume - BP well controlled, almost low for him.; on labetalol 300 bid; Nursing staff instructed again not to place BP cuff on either arm. Need to do leg BPs; BELOW outpt EDW of 85- minimal intake records of fluids; tends to large fluid gains as an outpt.  6. Nutrition - increase to 80/90 protein renal diet; add supplements; ate eggs and drank juice off of breakfast tray today .7. Hypothyroidism - TSH low at 0.297- defer to primary   Additional Objective Labs: Basic Metabolic Panel:  Lab 10/06/11 1610 10/03/11 0850 10/02/11 1045 10/01/11 0630  NA 136 140140 140 --  K 5.3* 4.85.0 4.3 --  CL 93* 9795* 98 --  CO2 25 2725 27 --  GLUCOSE 94 8177 120* --  BUN 71* 30*32* 18 --  CREATININE 13.52* 10.05*10.15* 7.45* --  CALCIUM 9.3 9.29.3 9.1 --  ALB -- -- -- --  PHOS -- 4.24.3 2.9 4.9*   Liver Function Tests:  Lab 10/03/11 0850 10/02/11 1045 10/01/11 0630 09/30/11 0610 09/29/11 1703  AST 17 -- -- 15 23  ALT 15 -- -- 14 15  ALKPHOS 85 -- -- 75 87  BILITOT 0.3  -- -- 0.4 0.3  PROT 7.7 -- -- 6.9 7.7  ALBUMIN 3.3*3.3* 3.2* 3.2* -- --   Lab 09/29/11 1715  AMMONIA 18  CBC:  Lab 10/06/11 0821 10/03/11 0726 10/02/11 1045 10/01/11 0630 09/30/11 0610 09/29/11 1703  WBC 7.3 6.3 6.8 -- -- --  NEUTROABS -- -- -- -- -- 7.0  HGB 10.9* 11.4* 11.5* -- -- --  HCT 32.3* 34.5* 35.9* -- -- --  MCV 97.3 100.6* 102.0* 101.3* 99.6 --  PLT 250 258 238 -- -- --   CBG:  Lab 10/01/11 2132  GLUCAP 96  Medications:      . amLODipine  10 mg Oral Daily  . aspirin  81 mg Oral Daily  . calcium acetate  1,334 mg Oral TID WC  . Chlorhexidine Gluconate Cloth  6 each Topical Q0600  . cinacalcet  30 mg Oral Q breakfast  . clopidogrel  75 mg Oral Q breakfast  . darbepoetin (ARANESP) injection - DIALYSIS  200 mcg Intravenous Q Wed-HD  . famotidine  20 mg Oral Daily  . labetalol  300 mg Oral BID  . levothyroxine  88 mcg Oral Q0600  . LORazepam  1 mg Intramuscular Once  .  multivitamin  1 tablet Oral QHS  . mupirocin ointment  1 application Nasal BID  . niacin  500 mg Oral Daily  . paricalcitol      . paricalcitol  8 mcg Intravenous Q T,Th,Sa-HD  . QUEtiapine  50 mg Oral QHS  . sodium chloride  3 mL Intravenous Q12H  . Travoprost (BAK Free)  1 drop Both Eyes BH-q7a    I  have reviewed scheduled and prn medications.  Sheffield Slider, PA-C [facility] Beeper [phone]  10/06/2011,12:05 PM  LOS: 7 days   Patient seen and examined and agree with assessment and plan as above.   Vinson Moselle  MD [employer] [phone] pgr    [phone] cell 10/06/2011, 3:58 PM
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
## 2011-10-06 NOTE — Progress Notes (Signed)
Pt refusing all medications an assessment. Being verbally aggressive and also refusing any assessment, saying that all this has been done and all this medication will kill someone. Pt is in bilateral wrist restraints but still attempting to kick at staff. Called security to assist with readjusting restraints. Completed and allowed pt to relax. States he does not want to be bothered. Will continue to assess q2hrs, resting quietly at this time  [facility]

## 2011-10-06 NOTE — Progress Notes (Signed)
Patient was taken to dialysis while I was receiving his report. Never administered direct patient care to the patient.
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
## 2011-10-06 NOTE — Progress Notes (Signed)
PT Cancellation Note  Treatment cancelled today due to pt in HD.Marland Kitchen  Jonita Hirota 10/06/2011, 11:42 AM  Skip Mayer PT [phone]

## 2011-10-06 NOTE — Progress Notes (Signed)
Clinical Social Work: Psych Services Follow Up:  Reviewed Psych Documentation as well as chart and patient's behaviors.  Patient remains aggressive and verbally disruptive to staff.  In restraints for his own safety as well as providers safety.  Family contact is on chart patient summary.  Discussed outcomes of 2/25 conversation with niece with CSW and treatment team.  If patient clears and returns to baseline he will most likely go home.  If he continues to be confused and aggressive, may need to consider SNF, but due to restraints and behaviors is not going to receive any bed offers nor can SNF handle him at this level.  Will allow medication to work and see if patient begins to clear and return to baseline.    Another option would be to consider a geriatric psych hospital if patient does not clear.  He is not voice homocidal or suicidal thoughts or plans.  However,  due to behaviors around verbal and physical aggression patient could become a danger to himself and/or other, may meet criteria for behaviors around dementia/delusional disorder and medication management.  Will staff with Psych MD and if appropriate will send referral to [facility].    But as of right now just monitoring behaviors and medication.  Will follow up.  Please call with questions or concerns. Ashley Jacobs, MSW LCSW [phone]

## 2011-10-06 NOTE — Progress Notes (Signed)
Patient ID: Jared [name], male   DOB: 13-Jul-1934, 78 y.o.   MRN: [id number]  Subjective: No events overnight. Patient still angry and yelling.  Objective:  Vital signs in last 24 hours:  Filed Vitals:   10/06/11 1113 10/06/11 1145 10/06/11 1200 10/06/11 1235  BP: 115/68 97/65 136/78 126/78  Pulse: 71 71 75 70  Temp:   98.5 F (36.9 C)   TempSrc:   Oral   Resp: 26 24 26 20   Height:      Weight:      SpO2:   100% 100%    Intake/Output from previous day:   Intake/Output Summary (Last 24 hours) at 10/06/11 2248 Last data filed at 10/06/11 1803  Gross per 24 hour  Intake    240 ml  Output   1024 ml  Net   -784 ml    Physical Exam: General: Alert, awake, refusing physical exam  Lab Results:  Basic Metabolic Panel:    Component Value Date/Time   NA 136 10/06/2011 0821   K 5.3* 10/06/2011 0821   CL 93* 10/06/2011 0821   CO2 25 10/06/2011 0821   BUN 71* 10/06/2011 0821   CREATININE 13.52* 10/06/2011 0821   GLUCOSE 94 10/06/2011 0821   CALCIUM 9.3 10/06/2011 0821   CBC:    Component Value Date/Time   WBC 7.3 10/06/2011 0821   HGB 10.9* 10/06/2011 0821   HCT 32.3* 10/06/2011 0821   PLT 250 10/06/2011 0821   MCV 97.3 10/06/2011 0821   NEUTROABS 7.0 09/29/2011 1703   LYMPHSABS 1.2 09/29/2011 1703   MONOABS 1.4* 09/29/2011 1703   EOSABS 0.2 09/29/2011 1703   BASOSABS 0.1 09/29/2011 1703      Lab 10/06/11 0821 10/03/11 0726 10/02/11 1045 10/01/11 0630 09/30/11 0610  WBC 7.3 6.3 6.8 6.0 8.0  HGB 10.9* 11.4* 11.5* 10.0* 8.5*  HCT 32.3* 34.5* 35.9* 30.6* 26.0*  PLT 250 258 238 207 198  MCV 97.3 100.6* 102.0* 101.3* 99.6  MCH 32.8 33.2 32.7 33.1 32.6  MCHC 33.7 33.0 32.0 32.7 32.7  RDW 13.0 13.6 13.6 13.9 14.3  LYMPHSABS -- -- -- -- --  MONOABS -- -- -- -- --  EOSABS -- -- -- -- --  BASOSABS -- -- -- -- --  BANDABS -- -- -- -- --    Lab 10/06/11 0821 10/03/11 0850 10/02/11 1045 10/01/11 0630 09/30/11 0610  NA 136 140140 140 141 141  K 5.3* 4.85.0 4.3 3.8 5.3*  CL  93* 9795* 98 98 95*  CO2 25 2725 27 29 24   GLUCOSE 94 8177 120* 131* 71  BUN 71* 30*32* 18 32* 98*  CREATININE 13.52* 10.05*10.15* 7.45* 9.37* 19.15*  CALCIUM 9.3 9.29.3 9.1 8.8 8.8  MG -- -- -- -- --   No results found for this basename: INR:5,PROTIME:5 in the last 168 hours Cardiac markers: No results found for this basename: CK:3,CKMB:3,TROPONINI:3,MYOGLOBIN:3 in the last 168 hours No components found with this basename: POCBNP:3 Recent Results (from the past 240 hour(s))  URINE CULTURE     Status: Normal   Collection Time   09/29/11  6:02 PM      Component Value Range Status Comment   Specimen Description URINE, CLEAN CATCH   Final    Special Requests NONE   Final    Culture  Setup Time [id number]   Final    Colony Count NO GROWTH   Final    Culture NO GROWTH   Final    Report Status 09/30/2011 FINAL  Final   MRSA PCR SCREENING     Status: Abnormal   Collection Time   09/30/11 11:34 AM      Component Value Range Status Comment   MRSA by PCR POSITIVE (*) NEGATIVE  Final     Studies/Results: No results found.  Medications: Scheduled Meds:   . amLODipine  10 mg Oral Daily  . aspirin  81 mg Oral Daily  . calcium acetate  1,334 mg Oral TID WC  . Chlorhexidine Gluconate Cloth  6 each Topical Q0600  . cinacalcet  30 mg Oral Q breakfast  . clopidogrel  75 mg Oral Q breakfast  . darbepoetin (ARANESP) injection - DIALYSIS  200 mcg Intravenous Q Wed-HD  . famotidine  20 mg Oral Daily  . labetalol  300 mg Oral BID  . levothyroxine  88 mcg Oral Q0600  . LORazepam  1 mg Intramuscular Once  . multivitamin  1 tablet Oral QHS  . mupirocin ointment  1 application Nasal BID  . niacin  500 mg Oral Daily  . paricalcitol      . paricalcitol  8 mcg Intravenous Q T,Th,Sa-HD  . QUEtiapine  50 mg Oral QHS  . sodium chloride  3 mL Intravenous Q12H  . Travoprost (BAK Free)  1 drop Both Eyes BH-q7a   Continuous Infusions:  PRN Meds:.acetaminophen, acetaminophen, haloperidol  lactate, haloperidol lactate, heparin, LORazepam, nitroGLYCERIN, ondansetron (ZOFRAN) IV, ondansetron, DISCONTD: LORazepam  Assessment/Plan: Principal Problem:  *Altered mental status  - unclear etiology at this time and I am not sure that it is metabolic in nature since current work up is not indicative of that  - pt has chronic renal failure on HD but no metabolic acidosis or alkalosis noted  - MRI is not suggestive of stroke  - I am questioning ? Psychiatric illness vs hospital acquired delirium  - appreciate psych consult  - will attempt calling POA, daughter in [place] today - start Seroquel as recommended by psych  Active Problems:  ESRD (end stage renal disease) on dialysis  - nephrology following   Hyperkalemia  - per nephrology recommendations  ANEMIA, CHRONIC  - remains stable  - CBC in AM   HYPERTENSION  - stable  - continue vitals per floor protocol   Awaiting call from daughter to get OK on transferring to [facility].   LOS: 7 days   MAGICK-Abiola Behring 10/06/2011, 10:48 PM  [facility] Pager: [phone]

## 2011-10-06 NOTE — Progress Notes (Signed)
10/06/2011  1255 Pt returns from HD calmer than he has been, however, still agitated and restless, confused, and verbally abusive making threats.  Pt will remain in bilateral soft wrist restraints with all four side rails up.  Reinforced to patient the criteria to which restraints would be removed, but I do not think he understood at all, and will need further reinforcement.  Pt did not wish to eat and declined toileting at this time.  Wrists checked for circulation, and appeared WNL without complaints of pain or signs of skin breakdown.  Physical assessment performed after explaining the purpose of the assessment and after reassuring patient that I would not cause any harm.  Pt did however continue to refuse assessment of skin on his bottom.  Heels appear dry and cracked but no sign of breakdown.  Will continue to monitor patient.  Eunice Blase

## 2011-10-07 LAB — BASIC METABOLIC PANEL
BUN: 32 mg/dL — ABNORMAL HIGH (ref 6–23)
CO2: 27 mEq/L (ref 19–32)
Chloride: 98 mEq/L (ref 96–112)
Creatinine, Ser: 7.98 mg/dL — ABNORMAL HIGH (ref 0.50–1.35)
Glucose, Bld: 91 mg/dL (ref 70–99)
Potassium: 5.3 mEq/L — ABNORMAL HIGH (ref 3.5–5.1)

## 2011-10-07 LAB — CBC
HCT: 35.8 % — ABNORMAL LOW (ref 39.0–52.0)
Hemoglobin: 12 g/dL — ABNORMAL LOW (ref 13.0–17.0)
MCH: 33.1 pg (ref 26.0–34.0)
MCHC: 33.5 g/dL (ref 30.0–36.0)
MCV: 98.9 fL (ref 78.0–100.0)
Platelets: 267 10*3/uL (ref 150–400)
RDW: 13 % (ref 11.5–15.5)

## 2011-10-07 MED ORDER — HEPARIN SODIUM (PORCINE) 1000 UNIT/ML DIALYSIS
20.0000 [IU]/kg | INTRAMUSCULAR | Status: DC | PRN
  Administered 2011-10-08: 1500 [IU] via INTRAVENOUS_CENTRAL
  Filled 2011-10-07: qty 2

## 2011-10-07 NOTE — Progress Notes (Signed)
Patient ID: Jared [name]    WRU:[id number]    DOB: 1933-11-30    DOA: 09/29/2011  PCP: Lonia Blood, MD, MD  Subjective: Yelling and angry, "get out of my room"  Objective: Weight change:   Intake/Output Summary (Last 24 hours) at 10/07/11 1931 Last data filed at 10/07/11 1700  Gross per 24 hour  Intake    840 ml  Output      0 ml  Net    840 ml   Blood pressure 138/78, pulse 66, temperature 98.7 F (37.1 C), temperature source Oral, resp. rate 20, height 5\' 5"  (1.651 m), weight 77.4 kg (170 lb 10.2 oz), SpO2 96.00%.  Physical Exam: Patient agitated and refused the examination  Lab Results: Basic Metabolic Panel:  Lab 10/07/11 9147 10/06/11 0821 10/03/11 0850  NA 140 136 --  K 5.3* 5.3* --  CL 98 93* --  CO2 27 25 --  GLUCOSE 91 94 --  BUN 32* 71* --  CREATININE 7.98* 13.52* --  CALCIUM 9.9 9.3 --  MG -- -- --  PHOS -- -- 4.24.3   Liver Function Tests:  Lab 10/03/11 0850 10/02/11 1045  AST 17 --  ALT 15 --  ALKPHOS 85 --  BILITOT 0.3 --  PROT 7.7 --  ALBUMIN 3.3*3.3* 3.2*   CBC:  Lab 10/07/11 0510 10/06/11 0821  WBC 7.3 7.3  NEUTROABS -- --  HGB 12.0* 10.9*  HCT 35.8* 32.3*  MCV 98.9 97.3  PLT 267 250    CBG:  Lab 10/01/11 2132  GLUCAP 96     Micro Results: Recent Results (from the past 240 hour(s))  URINE CULTURE     Status: Normal   Collection Time   09/29/11  6:02 PM      Component Value Range Status Comment   Specimen Description URINE, CLEAN CATCH   Final    Special Requests NONE   Final    Culture  Setup Time [id number]   Final    Colony Count NO GROWTH   Final    Culture NO GROWTH   Final    Report Status 09/30/2011 FINAL   Final   MRSA PCR SCREENING     Status: Abnormal   Collection Time   09/30/11 11:34 AM      Component Value Range Status Comment   MRSA by PCR POSITIVE (*) NEGATIVE  Final     Studies/Results: Ct Head Wo Contrast  09/29/2011    IMPRESSION: Chronic small vessel disease and volume loss. No acute  intracranial abnormality.  Original Report Authenticated By: Harley Hallmark, M.D.   Mr Brain Wo Contrast  10/03/2011   IMPRESSION: No visible acute stroke.  Severe atrophy with advanced chronic microvascular ischemic change.  Study limitations as described above.  Original Report Authenticated By: Elsie Stain, M.D.   Dg Chest Port 1 View  09/29/2011  *RADIOLOGY REPORT*  Clinical Data: Altered mental status.  PORTABLE CHEST - 1 VIEW  Comparison: 03/30/2011.  Findings: Progressive enlargement of the cardiac silhouette with a mild increase in prominence of the pulmonary vasculature.  Stable post CABG changes.  Unremarkable bones.  IMPRESSION: Mildly progressive cardiomegaly and pulmonary vascular congestion.  Original Report Authenticated By: Darrol Angel, M.D.    Medications: Scheduled Meds:   . amLODipine  10 mg Oral Daily  . aspirin  81 mg Oral Daily  . calcium acetate  1,334 mg Oral TID WC  . Chlorhexidine Gluconate Cloth  6 each Topical Q0600  . cinacalcet  30 mg Oral Q breakfast  . clopidogrel  75 mg Oral Q breakfast  . famotidine  20 mg Oral Daily  . labetalol  300 mg Oral BID  . levothyroxine  88 mcg Oral Q0600  . LORazepam  1 mg Intramuscular Once  . multivitamin  1 tablet Oral QHS  . mupirocin ointment  1 application Nasal BID  . niacin  500 mg Oral Daily  . paricalcitol  8 mcg Intravenous Q T,Th,Sa-HD  . QUEtiapine  50 mg Oral QHS  . sodium chloride  3 mL Intravenous Q12H  . Travoprost (BAK Free)  1 drop Both Eyes BH-q7a  . DISCONTD: darbepoetin (ARANESP) injection - DIALYSIS  200 mcg Intravenous Q Wed-HD   Continuous Infusions:    Assessment/Plan:   Principal Problem:  *Altered mental status: Likely secondary to dementia with agitation, MRI shows no stroke but patient has severe atrophy with chronic microvascular changes worsened with hospital acquired delirium/psychosis and possible underlying psychiatric illness  - appreciate psych consult  - I called [name] [phone]), who was unavailable on phone hence I left a detailed message  - Continue Seroquel as recommended by psych. I will appreciate if psychiatry service can follow the patient every day along with the medical service. If patient's behavior continues, he will likely be more appropriate for [facility] geriatrics psych facility.   Active Problems:  ESRD (end stage renal disease) on dialysis  - nephrology following   Hyperkalemia  - per nephrology recommendations   ANEMIA, CHRONIC  - remains stable  - CBC in AM   HYPERTENSION  - stable   DVT prophylaxis: SCDs  Code Status: Full code  Disposition: I was unable to reach the patient's daughter, Jared [name] ([phone]), left a detailed message. I will also talk to psychiatry service in AM to follow the patient every day along with medical service as patient's as patient's medical issues are currently stable and he needs more psych co-management at this point. He may be more appropriate for [facility] geriatrics psych facility at this time.    LOS: 8 days   Alyjah Lovingood M.D. [facility] 10/07/2011, 7:31 PM Pager: [phone]

## 2011-10-07 NOTE — Progress Notes (Signed)
PT Cancellation Note  Treatment cancelled today due to medical issues with patient which prohibited therapy. Spoke with RN who stated that when awake that patient was still very agitated and had been kicking and swatting at nursing staff. Patient had been given Haldol this morning. Concern is once patient is awake that it would be difficult to have patient lay back in bed without the assistance of security. RN did inform me that the patient is more responsive to the male gender. Will take this into consideration on the next attempt to see patient.   Fredrich Birks 10/07/2011, 11:24 AM 10/07/2011 Fredrich Birks PTA [phone] pager [phone] office

## 2011-10-07 NOTE — Progress Notes (Signed)
[facility] Progress Note Subjective:  Sleeping, rousable  Objective Filed Vitals:   10/06/11 1145 10/06/11 1200 10/06/11 1235 10/07/11 0000  BP: 97/65 136/78 126/78 158/85  Pulse: 71 75 70 69  Temp:  98.5 F (36.9 C)  99.6 F (37.6 C)  TempSrc:  Oral  Oral  Resp: 24 26 20 24   Height:      Weight:    77.4 kg (170 lb 10.2 oz)  SpO2:  100% 100% 99%   Physical Exam: refused full exam General: Calm Abdomen:  obese Extremities: no bilateral lower extremity edema; wrists restraints in place; no significant dependent edema Dialysis Access: left upper AVF patent (also right upper AVGG)  Problem/Plan: 1. AMS -calm at present - on seroquel first dose 2/26 2. ESRD -TTS HD orders written; remarkably calm on HD yesterday; interesting that pt had significant decrease in Cr 13.52 to 7.8 since yesteray, but there was no change in K of 5.3. Should have been on a 2 K bath; recheck pre HD tomorrow 3. Anemia - Hgb 12 (post HD); Epo 2200 outpt changed to 200 Aranesp 2/20; d/c Aranesp for now; resume at low dose if Hgb ,11.5 4. Secondary hyperparathyroidism - well controlled; continue Zemplar 8 and phoslo 2 and sensipar 30  5. HTN/volume - BP variable on labetalol 300 bid; Nursing staff instructed again not to place BP cuff on either arm. Need to do leg BPs; BELOW outpt EDW of 85- minimal intake records of fluids; tends to large fluid gains as an outpt.  6. Nutrition - increase to 80/90 protein renal diet; add supplements; ate eggs and drank juice off of breakfast tray today  7. Hypothyroidism - TSH low at 0.297- defer to primary  8. Fever - elevated temp 99.6 noted this am - follow; WBC normal without change    Additional Objective Labs: Basic Metabolic Panel:  Lab 10/07/11 4540 10/06/11 0821 10/03/11 0850 10/02/11 1045 10/01/11 0630  NA 140 136 140140 -- --  K 5.3* 5.3* 4.85.0 -- --  CL 98 93* 9795* -- --  CO2 27 25 2725 -- --  GLUCOSE 91 94 8177 -- --  BUN 32* 71* 30*32*  -- --  CREATININE 7.98* 13.52* 10.05*10.15* -- --  CALCIUM 9.9 9.3 9.29.3 -- --  ALB -- -- -- -- --  PHOS -- -- 4.24.3 2.9 4.9*   Liver Function Tests:  Lab 10/03/11 0850 10/02/11 1045 10/01/11 0630  AST 17 -- --  ALT 15 -- --  ALKPHOS 85 -- --  BILITOT 0.3 -- --  PROT 7.7 -- --  ALBUMIN 3.3*3.3* 3.2* 3.2*  CBC:  Lab 10/07/11 0510 10/06/11 0821 10/03/11 0726 10/02/11 1045 10/01/11 0630  WBC 7.3 7.3 6.3 -- --  NEUTROABS -- -- -- -- --  HGB 12.0* 10.9* 11.4* -- --  HCT 35.8* 32.3* 34.5* -- --  MCV 98.9 97.3 100.6* 102.0* 101.3*  PLT 267 250 258 -- --   BCBG:  Lab 10/01/11 2132  GLUCAP 96  Medications:      . amLODipine  10 mg Oral Daily  . aspirin  81 mg Oral Daily  . calcium acetate  1,334 mg Oral TID WC  . Chlorhexidine Gluconate Cloth  6 each Topical Q0600  . cinacalcet  30 mg Oral Q breakfast  . clopidogrel  75 mg Oral Q breakfast  . darbepoetin (ARANESP) injection - DIALYSIS  200 mcg Intravenous Q Wed-HD  . famotidine  20 mg Oral Daily  . labetalol  300 mg Oral  BID  . levothyroxine  88 mcg Oral Q0600  . LORazepam  1 mg Intramuscular Once  . multivitamin  1 tablet Oral QHS  . mupirocin ointment  1 application Nasal BID  . niacin  500 mg Oral Daily  . paricalcitol      . paricalcitol  8 mcg Intravenous Q T,Th,Sa-HD  . QUEtiapine  50 mg Oral QHS  . sodium chloride  3 mL Intravenous Q12H  . Travoprost (BAK Free)  1 drop Both Eyes BH-q7a    I  have reviewed scheduled and prn medications.  Sheffield Slider, PA-C [facility] Beeper [phone]  10/07/2011,8:25 AM  LOS: 8 days   Patient seen and examined and agree with assessment and plan as above.  K+ still up despite HD yesterday.  Recheck in am prior to HD.  Started on Seroquel per primary.  Vinson Moselle  MD [employer] [phone] pgr    [phone] cell 10/07/2011, 4:03 PM
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
## 2011-10-08 ENCOUNTER — Inpatient Hospital Stay (HOSPITAL_COMMUNITY): Payer: Medicare Other

## 2011-10-08 LAB — CBC
HCT: 31.3 % — ABNORMAL LOW (ref 39.0–52.0)
Hemoglobin: 10.7 g/dL — ABNORMAL LOW (ref 13.0–17.0)
MCH: 33.3 pg (ref 26.0–34.0)
MCHC: 34.2 g/dL (ref 30.0–36.0)
MCV: 97.5 fL (ref 78.0–100.0)
Platelets: 241 10*3/uL (ref 150–400)
RBC: 3.21 MIL/uL — ABNORMAL LOW (ref 4.22–5.81)
RDW: 12.9 % (ref 11.5–15.5)
WBC: 6 10*3/uL (ref 4.0–10.5)

## 2011-10-08 LAB — RENAL FUNCTION PANEL
Albumin: 3.1 g/dL — ABNORMAL LOW (ref 3.5–5.2)
BUN: 53 mg/dL — ABNORMAL HIGH (ref 6–23)
CO2: 25 mEq/L (ref 19–32)
Calcium: 9.1 mg/dL (ref 8.4–10.5)
Chloride: 95 mEq/L — ABNORMAL LOW (ref 96–112)
Creatinine, Ser: 10.97 mg/dL — ABNORMAL HIGH (ref 0.50–1.35)
GFR calc Af Amer: 5 mL/min — ABNORMAL LOW (ref >90)
GFR calc non Af Amer: 4 mL/min — ABNORMAL LOW (ref >90)
Glucose, Bld: 87 mg/dL (ref 70–99)
Phosphorus: 7.1 mg/dL — ABNORMAL HIGH (ref 2.3–4.6)
Potassium: 4.5 mEq/L (ref 3.5–5.1)
Sodium: 137 mEq/L (ref 135–145)

## 2011-10-08 LAB — GLUCOSE, CAPILLARY: Glucose-Capillary: 64 mg/dL — ABNORMAL LOW (ref 70–99)

## 2011-10-08 MED ORDER — PARICALCITOL 5 MCG/ML IV SOLN
INTRAVENOUS | Status: AC
  Administered 2011-10-08: 8 ug via INTRAVENOUS
  Filled 2011-10-08: qty 2

## 2011-10-08 NOTE — Progress Notes (Signed)
Clinical social work reviewed pt chart and MD recommendation for Geriatric Psych. CSW to follow up with Psych service line to initiate [facility] Referral. CSW to follow up with pt daughter regarding further discussion regarding geriatric psych.   Catha Gosselin, LCSWA  [phone] .Marland Kitchen2/28/2013 17:22pm

## 2011-10-08 NOTE — Progress Notes (Signed)
NURSING NOTE:  PT IN BILATERAL SOFT WRIST RESTRAINTS.  AT THIS TIME HE IS IN HD AND IS CALM AND QUIET UNLESS HE IS TOUCHED OR SPOKEN TO.  HE REFUSES TO ANSWER ANY QUESTIONS AND  TRIES TO RAISE UP OUT OF THE BED.  IF YOU TOUCH HIM OR AWAKEN HIM HE BEGINS YELLING CURSE WORDS AND THREATENING VIOLENCE IF YOU DONT LEAVE HIM ALONE. ONCE THE STIMULATION IS STOPPED, HE CLOSES HIS EYES AND REBEGINS RESTING QUIETLY.  PT IS NOTED TO BE CONFUSED AT THIS TIME.  Keiyon Plack [facility]
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
## 2011-10-08 NOTE — Progress Notes (Signed)
PT Cancellation Note  Treatment cancelled today due to patient receiving procedure or test, Patient in dialysis. Noted patient still agitated if aroused. Will continue to follow and encourage/progress mobility when appropriate.  Fredrich Birks 10/08/2011, 10:55 AM  10/08/2011 Fredrich Birks PTA [phone] pager [phone] office

## 2011-10-08 NOTE — Procedures (Signed)
I was present at this dialysis session. I have reviewed the session itself and made appropriate changes. BFR 350 and BP 120/80.   Vinson Moselle, MD [employer] 10/08/2011, 7:59 AM
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
## 2011-10-08 NOTE — Progress Notes (Signed)
[facility] Progress Note Subjective:  sleeping  Objective Filed Vitals:   10/07/11 0000 10/07/11 1317 10/07/11 1722 10/08/11 0801  BP: 158/85 135/66 138/78 138/72  Pulse: 69 64 66 62  Temp: 99.6 F (37.6 C) 98.9 F (37.2 C) 98.7 F (37.1 C)   TempSrc: Oral Oral Oral Oral  Resp: 24 20 20 14   Height:      Weight: 77.4 kg (170 lb 10.2 oz)   79.3 kg (174 lb 13.2 oz)  SpO2: 99% 93% 96% 94%   Physical Exam: did not waken General: sleeping, wrists restraints in place Heart: RRR Lungs: grossly CTAB Abdomen: soft Extremities: no LE edema Dialysis Access: left upper AVF - arterial pressure issues per nursing because during cannulation, pt jerked arm; adjusting needles  Dialysis Orders: Center: [facility] on TTS . EDW 85 kg HD Bath 2K/2.25Ca Time 4 hrs Heparin 2000 U. Access AVG @ LUA BFR 450 DFR 800 Zemplar 8 mcg IV/HD Epogen 2200 Units IV/HD Venofer 0   Problem/Plan: 1. AMS -calm at present - on seroquel first dose 2/26; still having angry outbursts; lilkely related to dementia with hospital acquired delirium/psychosis and possible underlying psychiatric illness. Slow to improve. 2. ESRD -TTS HD orders written;  calm on HD at present; interesting that pt had significant decrease in Cr 13.52 to 7.8 yesterday, but there was no change in K of 5.3. Labs today pending.  K+ 4.5 pre HD today. 3. Anemia - Hgb 12 2/27 (post HD); Epo 2200 outpt changed to 200 Aranesp 2/20; d/c Aranesp for now; resume at low dose if Hgb ,11.5 CBC pending 4. Secondary hyperparathyroidism - well controlled; continue Zemplar 8 and phoslo 2 and sensipar 30  5. HTN/volume - BP variable on labetalol 300 bid; Nursing staff instructed again not to place BP cuff on either arm. Need to do leg BPs; BELOW outpt EDW of 85- minimal intake records of fluids; tends to large fluid gains as an outpt.  6. Nutrition - increase to 80/90 protein renal diet; add supplements; 7. Hypothyroidism - TSH low at 0.297- defer to primary -  on synthroid 8. Fever - afebrile x 24 hr  - follow;  Additional Objective Labs: Basic Metabolic Panel:  Lab 10/07/11 1610 10/06/11 0821 10/03/11 0850 10/02/11 1045  NA 140 136 140140 --  K 5.3* 5.3* 4.85.0 --  CL 98 93* 9795* --  CO2 27 25 2725 --  GLUCOSE 91 94 8177 --  BUN 32* 71* 30*32* --  CREATININE 7.98* 13.52* 10.05*10.15* --  CALCIUM 9.9 9.3 9.29.3 --  ALB -- -- -- --  PHOS -- -- 4.24.3 2.9   Liver Function Tests:  Lab 10/03/11 0850 10/02/11 1045  AST 17 --  ALT 15 --  ALKPHOS 85 --  BILITOT 0.3 --  PROT 7.7 --  ALBUMIN 3.3*3.3* 3.2*  CBC:  Lab 10/07/11 0510 10/06/11 0821 10/03/11 0726 10/02/11 1045  WBC 7.3 7.3 6.3 --  NEUTROABS -- -- -- --  HGB 12.0* 10.9* 11.4* --  HCT 35.8* 32.3* 34.5* --  MCV 98.9 97.3 100.6* 102.0*  PLT 267 250 258 --  CBG:  Lab 10/01/11 2132  GLUCAP 96  Medications:      . amLODipine  10 mg Oral Daily  . aspirin  81 mg Oral Daily  . calcium acetate  1,334 mg Oral TID WC  . cinacalcet  30 mg Oral Q breakfast  . clopidogrel  75 mg Oral Q breakfast  . famotidine  20 mg Oral Daily  .  labetalol  300 mg Oral BID  . levothyroxine  88 mcg Oral Q0600  . LORazepam  1 mg Intramuscular Once  . multivitamin  1 tablet Oral QHS  . mupirocin ointment  1 application Nasal BID  . niacin  500 mg Oral Daily  . paricalcitol  8 mcg Intravenous Q T,Th,Sa-HD  . QUEtiapine  50 mg Oral QHS  . sodium chloride  3 mL Intravenous Q12H  . Travoprost (BAK Free)  1 drop Both Eyes BH-q7a  . DISCONTD: darbepoetin (ARANESP) injection - DIALYSIS  200 mcg Intravenous Q Wed-HD    I  have reviewed scheduled and prn medications.  Sheffield Slider, PA-C [facility] Beeper [phone]  10/08/2011,8:32 AM  LOS: 9 days   Patient seen and examined and agree with assessment and plan as above.   Vinson Moselle  MD [employer] [phone] pgr    [phone] cell 10/08/2011, 11:58 AM

## 2011-10-09 MED ORDER — DARBEPOETIN ALFA-POLYSORBATE 25 MCG/0.42ML IJ SOLN
6.2500 ug | INTRAMUSCULAR | Status: DC
  Administered 2011-10-10: 6.5476 ug via INTRAVENOUS
  Filled 2011-10-09: qty 0.42

## 2011-10-09 MED ORDER — HEPARIN SODIUM (PORCINE) 1000 UNIT/ML DIALYSIS
2000.0000 [IU] | INTRAMUSCULAR | Status: DC | PRN
  Administered 2011-10-13 (×2): 2000 [IU] via INTRAVENOUS_CENTRAL
  Filled 2011-10-09: qty 2

## 2011-10-09 MED ORDER — ZIPRASIDONE MESYLATE 20 MG IM SOLR
60.0000 mg | Freq: Every day | INTRAMUSCULAR | Status: DC
  Administered 2011-10-10 – 2011-10-13 (×3): 60 mg via INTRAMUSCULAR
  Filled 2011-10-09 (×8): qty 60

## 2011-10-09 MED ORDER — RISPERIDONE 1 MG PO TABS
1.0000 mg | ORAL_TABLET | Freq: Two times a day (BID) | ORAL | Status: DC
  Administered 2011-10-09: 1 mg via ORAL
  Filled 2011-10-09 (×4): qty 1

## 2011-10-09 NOTE — Progress Notes (Signed)
Physical Therapy Treatment Patient Details Name: Jared [name] MRN: [id number] DOB: 09-Dec-1933 Today's Date: 10/09/2011  PT Assessment/Plan  PT - Assessment/Plan Comments on Treatment Session: Pt was very pleasant and cooperative for me today.  Just prior to my arrival pt had refused to allow tech to test blood sugar and threatened her.  Pt seem to work better with males than females.  PT Plan: Discharge plan remains appropriate;Frequency needs to be updated PT Frequency: Min 2X/week Follow Up Recommendations: Skilled nursing facility Equipment Recommended: Defer to next venue PT Goals  Acute Rehab PT Goals PT Goal Formulation: Patient unable to participate in goal setting Time For Goal Achievement: 2 weeks Pt will go Supine/Side to Sit: with modified independence PT Goal: Supine/Side to Sit - Progress: Progressing toward goal Pt will go Sit to Supine/Side: with modified independence PT Goal: Sit to Supine/Side - Progress: Progressing toward goal Pt will go Sit to Stand: with supervision PT Goal: Sit to Stand - Progress: Progressing toward goal Pt will go Stand to Sit: with supervision PT Goal: Stand to Sit - Progress: Progressing toward goal Pt will Ambulate: 51 - 150 feet;with least restrictive assistive device;with supervision PT Goal: Ambulate - Progress: Progressing toward goal  PT Treatment Precautions/Restrictions  Precautions Precautions: Fall Restrictions Weight Bearing Restrictions: No Mobility (including Balance) Bed Mobility Bed Mobility: Yes Supine to Sit: 4: Min assist;HOB flat Supine to Sit Details (indicate cue type and reason): Assist to raise shoulders pt pulling on my hand.  Sitting - Scoot to [place] of Bed: 4: Min assist Sitting - Scoot to [place] of Bed Details (indicate cue type and reason): assist to adavnce hips due to weakness Sit to Supine: Not Tested (comment) Transfers Transfers: Yes Sit to Stand: 3: Mod assist;Patient percentage (comment);From  bed;From chair/3-in-1;With armrests;With upper extremity assist (pt 70%) Sit to Stand Details (indicate cue type and reason): Assist to stabilize pt and control anterior wt shift.  Pt holding my hand to aid in balance.  Stand to Sit: 4: Min assist;To bed;To chair/3-in-1;With armrests;With upper extremity assist Stand to Sit Details: cues for hand placement and controlled descent .  Stand Pivot Transfers: 4: Min assist Stand Pivot Transfer Details (indicate cue type and reason): Pt using RW, Min assist to manage walker.   Ambulation/Gait Ambulation/Gait: Yes Ambulation/Gait Assistance: 3: Mod assist;4: Min assist Ambulation/Gait Assistance Details (indicate cue type and reason): First trial HHA pt with bent over posture requiring mod assist for balance due to LE weakness.  Second trial with RW much improved. Pt still demonstrates bent over posture but only required min assist for steering walker  Ambulation Distance (Feet): 20 Feet (two trials of 20 feet.  5 minute of rest between trials.  ) Assistive device: 1 person hand held assist;Rolling walker Gait Pattern: Trunk flexed;Decreased step length - left;Decreased stance time - right Stairs: No Wheelchair Mobility Wheelchair Mobility: No  Posture/Postural Control Posture/Postural Control: Postural limitations Postural Limitations: flexed trunk posture Balance Balance Assessed: Yes Static Sitting Balance Static Sitting - Balance Support: Bilateral upper extremity supported;Feet supported Static Sitting - Level of Assistance: 5: Stand by assistance Static Sitting - Comment/# of Minutes: sat on EOB several minutes while resting from 1st trial of ambulation. No LOB or significant swaying noted.   Static Standing Balance Static Standing - Level of Assistance: Not tested (comment) Exercise    End of Session PT - End of Session Equipment Utilized During Treatment: Gait belt Activity Tolerance: Patient limited by fatigue Patient left: in  chair;with call  bell in reach (Tray table over lap and Chair alarm on ) Nurse Communication: Mobility status for transfers;Mobility status for ambulation (Pt eating so did not apply wrist restraints ) General Behavior During Session: [facility] for tasks performed Cognition: [facility] for tasks performed  Jared [name] 10/09/2011, 3:47 PM Devinne Epstein L. Chinonso Linker DPT [phone]

## 2011-10-09 NOTE — Progress Notes (Signed)
Pt refused.

## 2011-10-09 NOTE — Progress Notes (Signed)
Subjective:  Awake, alert and cooperative. States, "these people here are crazy!"  Vital signs in last 24 hours: Filed Vitals:   10/08/11 1239 10/08/11 1349 10/09/11 0900 10/09/11 1324  BP: 109/63 121/65 146/72 140/78  Pulse: 75  70 69  Temp: 98.2 F (36.8 C)  97.6 F (36.4 C) 98.3 F (36.8 C)  TempSrc: Axillary  Oral Oral  Resp: 19 20 17 18   Height:      Weight: 77.8 kg (171 lb 8.3 oz)     SpO2: 96%  98% 99%   Weight change:   Intake/Output Summary (Last 24 hours) at 10/09/11 1657 Last data filed at 10/09/11 1324  Gross per 24 hour  Intake    360 ml  Output      0 ml  Net    360 ml   Labs: Basic Metabolic Panel:  Lab 10/08/11 2130 10/07/11 0510 10/06/11 0821 10/03/11 0850  NA 137 140 136 --  K 4.5 5.3* 5.3* --  CL 95* 98 93* --  CO2 25 27 25  --  GLUCOSE 87 91 94 --  BUN 53* 32* 71* --  CREATININE 10.97* 7.98* 13.52* --  CALCIUM 9.1 9.9 9.3 --  ALB -- -- -- --  PHOS 7.1* -- -- 4.24.3   Liver Function Tests:  Lab 10/08/11 0914 10/03/11 0850  AST -- 17  ALT -- 15  ALKPHOS -- 85  BILITOT -- 0.3  PROT -- 7.7  ALBUMIN 3.1* 3.3*3.3*   No results found for this basename: LIPASE:3,AMYLASE:3 in the last 168 hours No results found for this basename: AMMONIA:3 in the last 168 hours CBC:  Lab 10/08/11 0914 10/07/11 0510 10/06/11 0821 10/03/11 0726  WBC 6.0 7.3 7.3 --  NEUTROABS -- -- -- --  HGB 10.7* 12.0* 10.9* --  HCT 31.3* 35.8* 32.3* --  MCV 97.5 98.9 97.3 100.6*  PLT 241 267 250 --   Cardiac Enzymes: No results found for this basename: CKTOTAL:5,CKMB:5,CKMBINDEX:5,TROPONINI:5 in the last 168 hours CBG:  Lab 10/08/11 1328  GLUCAP 64*    Iron Studies: No results found for this basename: IRON,TIBC,TRANSFERRIN,FERRITIN in the last 72 hours Studies/Results: No results found. Medications:      . amLODipine  10 mg Oral Daily  . aspirin  81 mg Oral Daily  . calcium acetate  1,334 mg Oral TID WC  . cinacalcet  30 mg Oral Q breakfast  . clopidogrel   75 mg Oral Q breakfast  . famotidine  20 mg Oral Daily  . labetalol  300 mg Oral BID  . levothyroxine  88 mcg Oral Q0600  . LORazepam  1 mg Intramuscular Once  . multivitamin  1 tablet Oral QHS  . niacin  500 mg Oral Daily  . paricalcitol  8 mcg Intravenous Q T,Th,Sa-HD  . risperiDONE  1 mg Oral BID  . sodium chloride  3 mL Intravenous Q12H  . Travoprost (BAK Free)  1 drop Both Eyes BH-q7a  . DISCONTD: QUEtiapine  50 mg Oral QHS    I  have reviewed scheduled and prn medications.  Physical Exam: General: comfortable, calm @ present Heart:RRR Lungs: diminished bases; prolonged expiratory phase Abdomen: obese, soft, NT/ND, +BS Extremities: no ankle edema Dialysis Access: LUA AVF +T/B  Problem/Plan:  1. AMS -calm at present - on seroquel first dose 2/26; still having angry outbursts per nursing staff; lilkely related to dementia with hospital acquired delirium/psychosis and possible underlying psychiatric illness. Slow to improve.  2. ESRD -TTS HD [facility]; next tomorrow  3. Anemia - Hgb 10.7, no ESA, resume Aranesp 6.25 mcg qwk and follow  4. Secondary hyperparathyroidism - phos now 7.1; prev 4.2 on binders, Sensiparand Zemplar; repeat renal panel tomorrow and adjust binders if necessary  5. HTN/volume - controlled; cont same meds and UF with HD; will need new eDW outpt 6. Nutrition - albumin 3.1; on ^ protein diet and prn suppl; appetite fair; follow trend  7. Hypothyroidism - TSH low at 0.297- defer to primary - on synthroid  8. Fever - remains afebrile; follow   9. DM- CBG and SSI per primary 10. Disposition- per primary services  Samuel Germany, FNP-C [facility] Pager [phone]  10/09/2011,4:57 PM  LOS: 10 days   Patient seen and examined and agree with assessment and plan as above.   Vinson Moselle  MD [employer] [phone] pgr    [phone] cell 10/10/2011, 9:46 AM

## 2011-10-09 NOTE — Progress Notes (Signed)
Pt concerned about paying his rent. States he doesn't have anyone to help him get it paid.Perhaps Social worker can help.

## 2011-10-09 NOTE — Progress Notes (Signed)
10/09/2011 patient refuse all am medication. Patient did take risperidone for Dr Isidoro Donning today. Biochemist, clinical.

## 2011-10-09 NOTE — Progress Notes (Signed)
Patient ID: Jared [name]    ZOX:[id number]    DOB: [birth year]    DOA: 09/29/2011  PCP: Lonia Blood, MD, MD  Subjective: Patient still uncooperative  Objective: Weight change:   Intake/Output Summary (Last 24 hours) at 10/09/11 1603 Last data filed at 10/09/11 1324  Gross per 24 hour  Intake    360 ml  Output      0 ml  Net    360 ml   Blood pressure 140/78, pulse 69, temperature 98.3 F (36.8 C), temperature source Oral, resp. rate 18, height 5\' 5"  (1.651 m), weight 77.8 kg (171 lb 8.3 oz), SpO2 99.00%.  Physical Exam: Patient  refused the examination  Lab Results: Basic Metabolic Panel:  Lab 10/08/11 8119 10/07/11 0510  NA 137 140  K 4.5 5.3*  CL 95* 98  CO2 25 27  GLUCOSE 87 91  BUN 53* 32*  CREATININE 10.97* 7.98*  CALCIUM 9.1 9.9  MG -- --  PHOS 7.1* --   Liver Function Tests:  Lab 10/08/11 0914 10/03/11 0850  AST -- 17  ALT -- 15  ALKPHOS -- 85  BILITOT -- 0.3  PROT -- 7.7  ALBUMIN 3.1* 3.3*3.3*   CBC:  Lab 10/08/11 0914 10/07/11 0510  WBC 6.0 7.3  NEUTROABS -- --  HGB 10.7* 12.0*  HCT 31.3* 35.8*  MCV 97.5 98.9  PLT 241 267    CBG:  Lab 10/08/11 1328  GLUCAP 64*     Micro Results: Recent Results (from the past 240 hour(s))  URINE CULTURE     Status: Normal   Collection Time   09/29/11  6:02 PM      Component Value Range Status Comment   Specimen Description URINE, CLEAN CATCH   Final    Special Requests NONE   Final    Culture  Setup Time [id number]   Final    Colony Count NO GROWTH   Final    Culture NO GROWTH   Final    Report Status 09/30/2011 FINAL   Final   MRSA PCR SCREENING     Status: Abnormal   Collection Time   09/30/11 11:34 AM      Component Value Range Status Comment   MRSA by PCR POSITIVE (*) NEGATIVE  Final     Studies/Results: Ct Head Wo Contrast  09/29/2011    IMPRESSION: Chronic small vessel disease and volume loss. No acute intracranial abnormality.  Original Report Authenticated By: Harley Hallmark, M.D.     Mr Brain Wo Contrast  10/03/2011   IMPRESSION: No visible acute stroke.  Severe atrophy with advanced chronic microvascular ischemic change.  Study limitations as described above.  Original Report Authenticated By: Elsie Stain, M.D.   Dg Chest Port 1 View  09/29/2011  *RADIOLOGY REPORT*  Clinical Data: Altered mental status.  PORTABLE CHEST - 1 VIEW  Comparison: 03/30/2011.  Findings: Progressive enlargement of the cardiac silhouette with a mild increase in prominence of the pulmonary vasculature.  Stable post CABG changes.  Unremarkable bones.  IMPRESSION: Mildly progressive cardiomegaly and pulmonary vascular congestion.  Original Report Authenticated By: Darrol Angel, M.D.    Medications: Scheduled Meds:    . amLODipine  10 mg Oral Daily  . aspirin  81 mg Oral Daily  . calcium acetate  1,334 mg Oral TID WC  . cinacalcet  30 mg Oral Q breakfast  . clopidogrel  75 mg Oral Q breakfast  . famotidine  20 mg Oral Daily  . labetalol  300 mg Oral BID  . levothyroxine  88 mcg Oral Q0600  . LORazepam  1 mg Intramuscular Once  . multivitamin  1 tablet Oral QHS  . niacin  500 mg Oral Daily  . paricalcitol  8 mcg Intravenous Q T,Th,Sa-HD  . risperiDONE  1 mg Oral BID  . sodium chloride  3 mL Intravenous Q12H  . Travoprost (BAK Free)  1 drop Both Eyes BH-q7a  . DISCONTD: QUEtiapine  50 mg Oral QHS   Continuous Infusions:    Assessment/Plan:   Principal Problem:  *Altered mental status: Likely secondary to dementia with agitation, MRI shows no stroke but patient has severe atrophy with chronic microvascular changes worsened with hospital acquired delirium/psychosis and possible underlying psychiatric illness  - I did start patient on risperidone per psychiatry recommendation however he refuses to take the medication  - Discussed with Dr. Ferol Luz (psychiatry service), recommended to start patient on Geodon 60 mg IM daily, DC risperidone. Per Dr. Ferol Luz, Geodon can be repeated every 8  hours when necessary for agitation and combativeness. If patient notes an EPS symptoms, start on Cogentin 0.5 mg IM BID PRN - Patient is likely be more appropriate for [facility] geriatrics psych facility.   Active Problems:  ESRD (end stage renal disease) on dialysis  - nephrology following   Hyperkalemia  - per nephrology recommendations   ANEMIA, CHRONIC  - remains stable  - CBC in AM   HYPERTENSION  - stable   DVT prophylaxis: SCDs  Code Status: Full code  Disposition: [name] [phone]) Follow Psych rec's,  more appropriate for [facility] geriatrics psych facility at this time than a SNF or rehab, psych to assist in disposition.    LOS: 10 days   Landon Truax M.D. [facility] 10/09/2011, 4:03 PM Pager: [phone]

## 2011-10-10 ENCOUNTER — Inpatient Hospital Stay (HOSPITAL_COMMUNITY): Payer: Medicare Other

## 2011-10-10 LAB — RENAL FUNCTION PANEL
BUN: 61 mg/dL — ABNORMAL HIGH (ref 6–23)
Glucose, Bld: 119 mg/dL — ABNORMAL HIGH (ref 70–99)
Phosphorus: 6.8 mg/dL — ABNORMAL HIGH (ref 2.3–4.6)
Potassium: 4.7 mEq/L (ref 3.5–5.1)

## 2011-10-10 LAB — GLUCOSE, CAPILLARY: Glucose-Capillary: 72 mg/dL (ref 70–99)

## 2011-10-10 LAB — CBC
HCT: 33.4 % — ABNORMAL LOW (ref 39.0–52.0)
Hemoglobin: 11.3 g/dL — ABNORMAL LOW (ref 13.0–17.0)
MCHC: 33.8 g/dL (ref 30.0–36.0)

## 2011-10-10 MED ORDER — CALCIUM ACETATE 667 MG PO CAPS
2001.0000 mg | ORAL_CAPSULE | Freq: Three times a day (TID) | ORAL | Status: DC
  Administered 2011-10-11 – 2011-10-12 (×3): 2001 mg via ORAL
  Filled 2011-10-10 (×15): qty 3

## 2011-10-10 MED ORDER — PARICALCITOL 5 MCG/ML IV SOLN
INTRAVENOUS | Status: AC
  Filled 2011-10-10: qty 2

## 2011-10-10 MED ORDER — DARBEPOETIN ALFA-POLYSORBATE 25 MCG/0.42ML IJ SOLN
INTRAMUSCULAR | Status: AC
  Filled 2011-10-10: qty 0.42

## 2011-10-10 MED ORDER — HALOPERIDOL 2 MG PO TABS
2.0000 mg | ORAL_TABLET | Freq: Once | ORAL | Status: AC
  Administered 2011-10-10: 2 mg via ORAL
  Filled 2011-10-10: qty 1

## 2011-10-10 NOTE — Progress Notes (Signed)
Subjective/Objective:  Denies any problems, reports slept well during the night. Still issues with refusing meds. Psych following. Encouraged cooperation and assistance with hygiene, grooming and stress compliance medications  Vital signs in last 24 hours: Filed Vitals:   10/10/11 0717 10/10/11 0730 10/10/11 0800 10/10/11 0830  BP: 172/91 148/94 135/77 121/82  Pulse: 68 71 69 72  Temp: 97.5 F (36.4 C)     TempSrc: Oral     Resp: 18 20 18 20   Height:      Weight: 79.9 kg (176 lb 2.4 oz)     SpO2: 96%      Weight change:   Intake/Output Summary (Last 24 hours) at 10/10/11 0856 Last data filed at 10/09/11 1741  Gross per 24 hour  Intake    240 ml  Output      0 ml  Net    240 ml   Labs: Basic Metabolic Panel:  Lab 10/10/11 4098 10/08/11 0914 10/07/11 0510  NA 138 137 140  K 4.7 4.5 5.3*  CL 94* 95* 98  CO2 23 25 27   GLUCOSE 119* 87 91  BUN 61* 53* 32*  CREATININE 11.06* 10.97* 7.98*  CALCIUM 9.7 9.1 9.9  ALB -- -- --  PHOS 6.8* 7.1* --   Liver Function Tests:  Lab 10/10/11 0731 10/08/11 0914  AST -- --  ALT -- --  ALKPHOS -- --  BILITOT -- --  PROT -- --  ALBUMIN 3.3* 3.1*   No results found for this basename: LIPASE:3,AMYLASE:3 in the last 168 hours No results found for this basename: AMMONIA:3 in the last 168 hours CBC:  Lab 10/10/11 0731 10/08/11 0914 10/07/11 0510 10/06/11 0821  WBC 7.6 6.0 7.3 --  NEUTROABS -- -- -- --  HGB 11.3* 10.7* 12.0* --  HCT 33.4* 31.3* 35.8* --  MCV 97.7 97.5 98.9 97.3  PLT 240 241 267 --   Cardiac Enzymes: No results found for this basename: CKTOTAL:5,CKMB:5,CKMBINDEX:5,TROPONINI:5 in the last 168 hours CBG:  Lab 10/08/11 1328  GLUCAP 64*    Iron Studies: No results found for this basename: IRON,TIBC,TRANSFERRIN,FERRITIN in the last 72 hours Studies/Results: No results found. Medications:      . amLODipine  10 mg Oral Daily  . aspirin  81 mg Oral Daily  . calcium acetate  1,334 mg Oral TID WC  . cinacalcet   30 mg Oral Q breakfast  . clopidogrel  75 mg Oral Q breakfast  . darbepoetin      . darbepoetin (ARANESP) injection - DIALYSIS  6.5476 mcg Intravenous Q Sat-HD  . famotidine  20 mg Oral Daily  . labetalol  300 mg Oral BID  . levothyroxine  88 mcg Oral Q0600  . LORazepam  1 mg Intramuscular Once  . multivitamin  1 tablet Oral QHS  . niacin  500 mg Oral Daily  . paricalcitol      . paricalcitol  8 mcg Intravenous Q T,Th,Sa-HD  . sodium chloride  3 mL Intravenous Q12H  . Travoprost (BAK Free)  1 drop Both Eyes BH-q7a  . ziprasidone  60 mg Intramuscular Daily  . DISCONTD: QUEtiapine  50 mg Oral QHS  . DISCONTD: risperiDONE  1 mg Oral BID    I  have reviewed scheduled and prn medications.  Physical Exam:  General: comfortable, calm @ present  Heart:RRR  Lungs: diminished bases; prolonged expiratory phase, no R/R/W's Abdomen: obese, soft, NT/ND, +BS  Extremities: no ankle edema  Dialysis Access: LUA AVF +T/B   Problem/Plan:  1. AMS -calm at present - Psych following: on seroquel first dose 2/26, but refusing Haldol; recommendations Geodon; defer to primary 2. ESRD -TTS HD [facility]; tol HD well this am 3. Anemia - Hgb 11.3; received Aranesp 6.25 mcg  With Hgb prev 10.7; will D/C ESA and follow trend 4. Secondary hyperparathyroidism - phos now 6.8; remains above goal; denies noncompliance; will adjust dose binders; cont Sensipar and Zemplar; follow ca/phos 5. HTN/volume - controlled; cont same meds and UF with HD; will need new eDW outpt  6. Nutrition - albumin ^ 3.3; on ^ protein diet and prn suppl; appetite fair; follow trend  7. Hypothyroidism - TSH low at 0.297- defer to primary - on synthroid  8. Fever - resolved 9. DM- CBG and SSI per primary  10. Disposition- per primary services  Samuel Germany, FNP-C [facility] Pager [phone]  10/10/2011,8:56 AM  LOS: 11 days   Patient seen and examined and agree with assessment and plan as above.   Vinson Moselle  MD [employer] [phone] pgr    [phone] cell 10/10/2011, 9:47 AM

## 2011-10-10 NOTE — Progress Notes (Signed)
Patient ID: Jared [name]    ZOX:[id number]    DOB: July 20, 1934    DOA: 09/29/2011  PCP: Lonia Blood, MD, MD  Subjective: Patient still uncooperative, seen with RN together. Refusing all medications, agitated stating 'I need to get out of here, I have to pay my bills, you all are crazy '  Objective: Weight change:   Intake/Output Summary (Last 24 hours) at 10/10/11 1413 Last data filed at 10/10/11 1110  Gross per 24 hour  Intake    120 ml  Output   1900 ml  Net  -1780 ml   Blood pressure 92/52, pulse 68, temperature 97.5 F (36.4 C), temperature source Oral, resp. rate 18, height 5\' 5"  (1.651 m), weight 78 kg (171 lb 15.3 oz), SpO2 100.00%.  Physical Exam: Patient  refused the examination  Lab Results: Basic Metabolic Panel:  Lab 10/10/11 8119 10/08/11 0914  NA 138 137  K 4.7 4.5  CL 94* 95*  CO2 23 25  GLUCOSE 119* 87  BUN 61* 53*  CREATININE 11.06* 10.97*  CALCIUM 9.7 9.1  MG -- --  PHOS 6.8* --   Liver Function Tests:  Lab 10/10/11 0731 10/08/11 0914  AST -- --  ALT -- --  ALKPHOS -- --  BILITOT -- --  PROT -- --  ALBUMIN 3.3* 3.1*   CBC:  Lab 10/10/11 0731 10/08/11 0914  WBC 7.6 6.0  NEUTROABS -- --  HGB 11.3* 10.7*  HCT 33.4* 31.3*  MCV 97.7 97.5  PLT 240 241    CBG:  Lab 10/08/11 1328  GLUCAP 64*     Micro Results: No results found for this or any previous visit (from the past 240 hour(s)).  Studies/Results: Ct Head Wo Contrast  09/29/2011    IMPRESSION: Chronic small vessel disease and volume loss. No acute intracranial abnormality.  Original Report Authenticated By: Harley Hallmark, M.D.   Mr Brain Wo Contrast  10/03/2011   IMPRESSION: No visible acute stroke.  Severe atrophy with advanced chronic microvascular ischemic change.  Study limitations as described above.  Original Report Authenticated By: Elsie Stain, M.D.   Dg Chest Port 1 View  09/29/2011  *RADIOLOGY REPORT*  Clinical Data: Altered mental status.  PORTABLE CHEST - 1  VIEW  Comparison: 03/30/2011.  Findings: Progressive enlargement of the cardiac silhouette with a mild increase in prominence of the pulmonary vasculature.  Stable post CABG changes.  Unremarkable bones.  IMPRESSION: Mildly progressive cardiomegaly and pulmonary vascular congestion.  Original Report Authenticated By: Darrol Angel, M.D.    Medications: Scheduled Meds:    . amLODipine  10 mg Oral Daily  . aspirin  81 mg Oral Daily  . calcium acetate  2,001 mg Oral TID WC  . cinacalcet  30 mg Oral Q breakfast  . clopidogrel  75 mg Oral Q breakfast  . darbepoetin      . famotidine  20 mg Oral Daily  . labetalol  300 mg Oral BID  . levothyroxine  88 mcg Oral Q0600  . LORazepam  1 mg Intramuscular Once  . multivitamin  1 tablet Oral QHS  . niacin  500 mg Oral Daily  . paricalcitol      . paricalcitol  8 mcg Intravenous Q T,Th,Sa-HD  . sodium chloride  3 mL Intravenous Q12H  . Travoprost (BAK Free)  1 drop Both Eyes BH-q7a  . ziprasidone  60 mg Intramuscular Daily  . DISCONTD: calcium acetate  1,334 mg Oral TID WC  . DISCONTD: darbepoetin (ARANESP)  injection - DIALYSIS  6.5476 mcg Intravenous Q Sat-HD  . DISCONTD: risperiDONE  1 mg Oral BID   Continuous Infusions:    Assessment/Plan:   Principal Problem:  *Altered mental status: Likely secondary to dementia with agitation, MRI shows no stroke but patient has severe atrophy with chronic microvascular changes worsened with hospital acquired delirium/psychosis and possible underlying psychiatric illness  - I did start patient on risperidone per psychiatry recommendation however he refuses to take the medication  - Then I started him on Geodon per psychiatry recommendations (Dr. Ferol Luz) Geodon 60 mg IM daily and DC'd risperidone. But patient continues to refuse all medications. (Per Dr. Ferol Luz, Geodon can be repeated every 8 hours when necessary for agitation and combativeness. If patient notes an EPS symptoms, start on Cogentin 0.5 mg  IM BID PRN) - Patient is likely be more appropriate for [facility] ger[facility] psych facility.   Active Problems:  ESRD (end stage renal disease) on dialysis  - nephrology following   Hyperkalemia  - per nephrology recommendations   ANEMIA, CHRONIC  - remains stable   HYPERTENSION  - stable   DVT prophylaxis: SCDs  Code Status: Full code  Disposition: [name] [phone]) Follow Psych rec's,  more appropriate for [facility] psych facility at this time than a SNF or rehab, psych to assist in disposition.    LOS: 11 days   Corvette Orser M.D. [facility] 10/10/2011, 2:13 PM Pager: [phone]

## 2011-10-10 NOTE — Consult Note (Signed)
Reason for Consult:Altered mental status, medical influence; Dementia  Referring Physician: Dr. Ardith Dark is an 78 y.o. male.  ZOX:[id number] was admitted to the [facility] cone renal unit with the end-stage renal disease, hypothyroidism, chronic anemia, hypertension, coronary artery disease and altered mental status. Psychiatric consult was requested for the capacity evaluation. Patient was the not cooperate, irritable easily getting annoyed, and refuse to treatment recommendations and services. Patient stated that he wanted to leave the hospital and go home without contacting the family members or following the medical team recommendations. I have spoken with the staff nurse caring for him who indicated me that he has been poorly cooperative with the staff, verbally and physically agitated and aggressive. Wrist restraints has been placed with security support. Pt has been very irritable, aggressive requiring soft restraints after being combative with staff and refuses medication. Marland Kitchen   AXIS I Altered mental status due to medical condition; Dementia  AXIS II Deferred AXIS III Past Medical History  Diagnosis Date  . Psychosexual dysfunction with inhibited sexual excitement   . Other specified personal history presenting hazards to health   . Generalized osteoarthrosis, unspecified site   . Osteoarthrosis, unspecified whether generalized or localized, unspecified site   . 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 Unspecified hypothyroidism   . Gout   . CAD (coronary artery disease)   . Unspecified deficiency anemia   . Unspecified essential hypertension   . End stage renal disease   . Myocardial infarction 1964  . Back pain     Past Surgical History  Procedure Date  . Cabg     1994  . Av fistula placement   . Coronary artery bypass graft   AXIS IV  Lacking social support, non-adherence to medical regimen; complex multi-organ system problems AXIS V   GAF 40  Family History  Problem Relation Age of Onset  . Heart disease Mother   . Heart disease Father   . Heart disease Sister     Social History:  reports that he quit smoking about 50 years ago. His smoking use included Cigarettes. He quit after 15 years of use. He has never used smokeless tobacco. He reports that he does not drink alcohol or use illicit drugs.  Allergies: No Known Allergies  Medications:   I have reviewed patient's current meications  Results for orders placed during the hospital encounter of 09/29/11 (from the past 48 hour(s))  CBC     Status: Abnormal   Collection Time   10/08/11  9:14 AM      Component Value Range Comment   WBC 6.0  4.0 - 10.5 (K/uL)    RBC 3.21 (*) 4.22 - 5.81 (MIL/uL)    Hemoglobin 10.7 (*) 13.0 - 17.0 (g/dL)    HCT 09.8 (*) 11.9 - 52.0 (%)    MCV 97.5  78.0 - 100.0 (fL)    MCH 33.3  26.0 - 34.0 (pg)    MCHC 34.2  30.0 - 36.0 (g/dL)    RDW 14.7  82.9 - 56.2 (%)    Platelets 241  150 - 400 (K/uL)   RENAL FUNCTION PANEL     Status: Abnormal   Collection Time   10/08/11  9:14 AM      Component Value Range Comment   Sodium 137  135 - 145 (mEq/L)    Potassium 4.5  3.5 - 5.1 (mEq/L)    Chloride 95 (*) 96 - 112 (mEq/L)  CO2 25  19 - 32 (mEq/L)    Glucose, Bld 87  70 - 99 (mg/dL)    BUN 53 (*) 6 - 23 (mg/dL) DELTA CHECK NOTED   Creatinine, Ser 10.97 (*) 0.50 - 1.35 (mg/dL)    Calcium 9.1  8.4 - 10.5 (mg/dL)    Phosphorus 7.1 (*) 2.3 - 4.6 (mg/dL)    Albumin 3.1 (*) 3.5 - 5.2 (g/dL)    GFR calc non Af Amer 4 (*) >90 (mL/min)    GFR calc Af Amer 5 (*) >90 (mL/min)   GLUCOSE, CAPILLARY     Status: Abnormal   Collection Time   10/08/11  1:28 PM      Component Value Range Comment   Glucose-Capillary 64 (*) 70 - 99 (mg/dL)     No results found.  Review of Systems  Unable to perform ROS: other   Blood pressure 140/78, pulse 69, temperature 98.3 F (36.8 C), temperature source Oral, resp. rate 18, height 5\' 5"  (1.651 m), weight 77.8  kg (171 lb 8.3 oz), SpO2 99.00%. Physical Exam  Assessment/Plan:  Chart reviewed, Discussed with Dr. Isidoro Donning ~5:50 pm  And Discussed with patient ~ 5 50 pm 10/09/11 Pt is lying in preferred position [does not want to move] and appears uncomfortable.  His RN states that he refuses to be cared for touched and refuses medication.  Dr. Isidoro Donning went with Rn and pt took medication from doctor.  He begins as soon as someone walks with with derogatory myogenous comments. He is surly today but not bizarre or psychotic comments are expressed He is surly.  He appears incapable for understanding the processes and purpose of his treatment.  He has very poor insight He is reminded when he begins calling the RNs and staff stupid, that they are there to help him get better and they are specifically there to follow the doctor's orders.  He becomes silent. RECOMMENDATION: 1. In discussion with Dr. Isidoro Donning, oral haldol may not be effective because patient refuses pills. 2. Consider use of Geodon 60 mg IM to diminish harm to others  With 1 repeat daily as needed 3. Watch for EPS if present Cogentin 0.5 mg twice daily. As needed 4. Pt does not demonstrate capacity to be oriented or have insight to understand OR cooperate with treatment that is required for renal failure 5. Consider coordination for transfer to [facility] and coordination of HD services.  6. No further psychiatric needs identified.  MD Psychiatrist signs off    Sibbie Flammia 10/10/2011, 1:13 AM

## 2011-10-10 NOTE — Procedures (Signed)
I was present at this dialysis session. I have reviewed the session itself and made appropriate changes. BFR is 450, VP is 290 and UF goal is 3 kg today. BP 131/77. No complaints.  Vinson Moselle, MD [employer] 10/10/2011, 6:37 PM
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
## 2011-10-11 MED ORDER — HALOPERIDOL 1 MG PO TABS
1.0000 mg | ORAL_TABLET | Freq: Four times a day (QID) | ORAL | Status: DC | PRN
  Administered 2011-10-11: 1 mg via ORAL
  Filled 2011-10-11 (×2): qty 1

## 2011-10-11 MED ORDER — HALOPERIDOL LACTATE 5 MG/ML IJ SOLN: 1.0000 mg | Freq: Four times a day (QID) | INTRAMUSCULAR | Status: DC | PRN

## 2011-10-11 NOTE — Progress Notes (Signed)
Patient's daughter called in response to my non-emergency voicemail message,described the incident,patient was so kind of disbelieved and was telling that ''that the baseline of my father,you see he has the money in the bank and he needs to look for that and there is a woman i met on the phone name joan....naturally he would become agitated..... Jared [name] wishes to talk to md and gave her free time to be call at.her frre time is sunday night and whole day of Monday and wenesday.md made aware.

## 2011-10-11 NOTE — Progress Notes (Signed)
Subjective:  Denies any problems this am; reports slept well during the night and going home on Monday  Vital signs in last 24 hours: Filed Vitals:   10/10/11 1030 10/10/11 1110 10/10/11 1731 10/11/11 0900  BP: 131/77 92/52 208/85 107/67  Pulse: 66 68 76 104  Temp:   97.5 F (36.4 C) 97.8 F (36.6 C)  TempSrc:   Oral Oral  Resp: 18 18 17 18   Height:      Weight:  78 kg (171 lb 15.3 oz)    SpO2:  100% 96% 92%   Weight change:   Intake/Output Summary (Last 24 hours) at 10/11/11 1143 Last data filed at 10/11/11 0900  Gross per 24 hour  Intake    120 ml  Output      0 ml  Net    120 ml   Labs: Basic Metabolic Panel:  Lab 10/10/11 6213 10/08/11 0914 10/07/11 0510  NA 138 137 140  K 4.7 4.5 5.3*  CL 94* 95* 98  CO2 23 25 27   GLUCOSE 119* 87 91  BUN 61* 53* 32*  CREATININE 11.06* 10.97* 7.98*  CALCIUM 9.7 9.1 9.9  ALB -- -- --  PHOS 6.8* 7.1* --   Liver Function Tests:  Lab 10/10/11 0731 10/08/11 0914  AST -- --  ALT -- --  ALKPHOS -- --  BILITOT -- --  PROT -- --  ALBUMIN 3.3* 3.1*   No results found for this basename: LIPASE:3,AMYLASE:3 in the last 168 hours No results found for this basename: AMMONIA:3 in the last 168 hours CBC:  Lab 10/10/11 0731 10/08/11 0914 10/07/11 0510 10/06/11 0821  WBC 7.6 6.0 7.3 --  NEUTROABS -- -- -- --  HGB 11.3* 10.7* 12.0* --  HCT 33.4* 31.3* 35.8* --  MCV 97.7 97.5 98.9 97.3  PLT 240 241 267 --   Cardiac Enzymes: No results found for this basename: CKTOTAL:5,CKMB:5,CKMBINDEX:5,TROPONINI:5 in the last 168 hours CBG:  Lab 10/10/11 1725 10/08/11 1328  GLUCAP 72 64*    Iron Studies: No results found for this basename: IRON,TIBC,TRANSFERRIN,FERRITIN in the last 72 hours Studies/Results: No results found. Medications:      . amLODipine  10 mg Oral Daily  . aspirin  81 mg Oral Daily  . calcium acetate  2,001 mg Oral TID WC  . cinacalcet  30 mg Oral Q breakfast  . clopidogrel  75 mg Oral Q breakfast  . darbepoetin       . famotidine  20 mg Oral Daily  . haloperidol  2 mg Oral Once  . labetalol  300 mg Oral BID  . levothyroxine  88 mcg Oral Q0600  . LORazepam  1 mg Intramuscular Once  . multivitamin  1 tablet Oral QHS  . niacin  500 mg Oral Daily  . paricalcitol      . paricalcitol  8 mcg Intravenous Q T,Th,Sa-HD  . sodium chloride  3 mL Intravenous Q12H  . Travoprost (BAK Free)  1 drop Both Eyes BH-q7a  . ziprasidone  60 mg Intramuscular Daily    I  have reviewed scheduled and prn medications.  Physical Exam:  General: comfortable, calm @ present  Heart:RRR  Lungs: diminished bases; prolonged expiratory phase, no R/R/W's  Abdomen: obese, soft, NT/ND, +BS  Extremities: no ankle edema  Dialysis Access: LUA AVF +T/B   Problem/Plan:  1. AMS -calm at present - Psych following: on seroquel first dose 2/26, Geodon injection given yesterday following violent outburst; RESTRAINED; has prn Haldol, and Ativan; calm  this am and cooperative with po meds per staff; follow behavior closely as this determines his ability to have ongoing hemodialysis   2. ESRD -TTS HD [place]; next Tx on Tu 3. Anemia - Hgb 11.3; no ESA; follow trend  4. Secondary hyperparathyroidism - phos 6.8; ^ dose yesterday; Ca 9.7 (corrected 10.26); cont Sensipar and Zemplar ( ) for now; follow ca/phos closely 5. HTN/volume - controlled; cont same meds and UF with HD; will need new eDW outpt  6. Nutrition - albumin ^ 3.3; on ^ protein diet and prn suppl; appetite fair; follow trend  7. Hypothyroidism - TSH low at 0.297- defer to primary - on synthroid  8. Fever - resolved yesterday but refusing temp checks now  9. DM- CBG and SSI per primary  10. Disposition- per primary services; as noted # 1, behavior must be corrected to remain on HD   Samuel Germany, [facility] Pager [phone]  10/11/2011,11:43 AM  LOS: 12 days   Patient seen and examined and agree with assessment and plan as above.   Vinson Moselle   MD [facility] [phone] pgr    [phone] cell 10/11/2011, 4:06 PM

## 2011-10-11 NOTE — Progress Notes (Signed)
Patient ID: Jared [name]    UJW:[id number]    DOB: July 11, 1934    DOA: 09/29/2011  PCP: Lonia Blood, MD, MD  Subjective: Patient still uncooperative   Objective: Weight change:   Intake/Output Summary (Last 24 hours) at 10/11/11 2148 Last data filed at 10/11/11 1730  Gross per 24 hour  Intake    600 ml  Output      0 ml  Net    600 ml   Blood pressure 143/75, pulse 63, temperature 99.7 F (37.6 C), temperature source Axillary, resp. rate 18, height 5\' 5"  (1.651 m), weight 78 kg (171 lb 15.3 oz), SpO2 92.00%.  Physical Exam: Patient  refused the examination  Lab Results: Basic Metabolic Panel:  Lab 10/10/11 5621 10/08/11 0914  NA 138 137  K 4.7 4.5  CL 94* 95*  CO2 23 25  GLUCOSE 119* 87  BUN 61* 53*  CREATININE 11.06* 10.97*  CALCIUM 9.7 9.1  MG -- --  PHOS 6.8* --   Liver Function Tests:  Lab 10/10/11 0731 10/08/11 0914  AST -- --  ALT -- --  ALKPHOS -- --  BILITOT -- --  PROT -- --  ALBUMIN 3.3* 3.1*   CBC:  Lab 10/10/11 0731 10/08/11 0914  WBC 7.6 6.0  NEUTROABS -- --  HGB 11.3* 10.7*  HCT 33.4* 31.3*  MCV 97.7 97.5  PLT 240 241    CBG:  Lab 10/10/11 1725 10/08/11 1328  GLUCAP 72 64*     Micro Results: No results found for this or any previous visit (from the past 240 hour(s)).  Studies/Results: Ct Head Wo Contrast  09/29/2011    IMPRESSION: Chronic small vessel disease and volume loss. No acute intracranial abnormality.  Original Report Authenticated By: Harley Hallmark, M.D.   Mr Brain Wo Contrast  10/03/2011   IMPRESSION: No visible acute stroke.  Severe atrophy with advanced chronic microvascular ischemic change.  Study limitations as described above.  Original Report Authenticated By: Elsie Stain, M.D.   Dg Chest Port 1 View  09/29/2011  *RADIOLOGY REPORT*  Clinical Data: Altered mental status.  PORTABLE CHEST - 1 VIEW  Comparison: 03/30/2011.  Findings: Progressive enlargement of the cardiac silhouette with a mild increase in  prominence of the pulmonary vasculature.  Stable post CABG changes.  Unremarkable bones.  IMPRESSION: Mildly progressive cardiomegaly and pulmonary vascular congestion.  Original Report Authenticated By: Darrol Angel, M.D.    Medications: Scheduled Meds:    . amLODipine  10 mg Oral Daily  . aspirin  81 mg Oral Daily  . calcium acetate  2,001 mg Oral TID WC  . cinacalcet  30 mg Oral Q breakfast  . clopidogrel  75 mg Oral Q breakfast  . famotidine  20 mg Oral Daily  . haloperidol  2 mg Oral Once  . labetalol  300 mg Oral BID  . levothyroxine  88 mcg Oral Q0600  . LORazepam  1 mg Intramuscular Once  . multivitamin  1 tablet Oral QHS  . niacin  500 mg Oral Daily  . paricalcitol  8 mcg Intravenous Q T,Th,Sa-HD  . sodium chloride  3 mL Intravenous Q12H  . Travoprost (BAK Free)  1 drop Both Eyes BH-q7a  . ziprasidone  60 mg Intramuscular Daily   Continuous Infusions:    Assessment/Plan:   Principal Problem:  *Altered mental status: Likely secondary to dementia with agitation, MRI shows no stroke but patient has severe atrophy with chronic microvascular changes worsened with hospital acquired delirium/psychosis  and possible underlying psychiatric illness  -Placed on risperidone, then Geodon IM per psychiatry recommendation however he refuses to take the medication.  - But patient continues to refuse all medications. (Per Dr. Ferol Luz, Geodon can be repeated every 8 hours when necessary for agitation and combativeness. If patient notes an EPS symptoms, start on Cogentin 0.5 mg IM BID PRN) - Patient is likely be more appropriate for [facility] geriatrics psych facility.   Active Problems:  ESRD (end stage renal disease) on dialysis  - nephrology following   Hyperkalemia  - per nephrology recommendations   ANEMIA, CHRONIC  - remains stable   HYPERTENSION  - stable   DVT prophylaxis: SCDs  Code Status: Full code  Disposition: [name] [phone]).  I will call  patient's daughter tomorrow, however even if this is patient's 'baseline' (per RN's note referring to the daughter's comment), he still has dementia with combative behavior. I am not sure if he can manage at home without 24/7 supervision by family or be safer at SNF/[place] [facility].    LOS: 12 days   Nalla Purdy M.D. [facility] 10/11/2011, 9:48 PM Pager: [phone]

## 2011-10-11 NOTE — Progress Notes (Signed)
Pt  Slept  Most of  The  Night    Was  Given  2 mg   Haldol with  [place]   .     No  Verbal  Abuse  X    8   Hours

## 2011-10-12 ENCOUNTER — Other Ambulatory Visit: Payer: Self-pay

## 2011-10-12 MED ORDER — INSULIN GLARGINE 100 UNIT/ML ~~LOC~~ SOLN
5.0000 [IU] | Freq: Once | SUBCUTANEOUS | Status: DC
  Filled 2011-10-12: qty 3
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
## 2011-10-12 NOTE — Progress Notes (Signed)
Physical Therapy Treatment Patient Details Name: Jared [name] MRN: [id number] DOB: 06/14/1934 Today's Date: 10/12/2011  PT Assessment/Plan  PT - Assessment/Plan Comments on Treatment Session: Pt continues to be cooperative with male PT and was polite to male PT tech. Pt continues to struggle with mobility due to weakness but is making significant progress.  Pt continues to present as a significant falls risk and would benefit from SNF placement for continued rehab.  PT Plan: Discharge plan remains appropriate;Frequency needs to be updated PT Frequency: Min 2X/week Follow Up Recommendations: Skilled nursing facility Equipment Recommended: Defer to next venue PT Goals  Acute Rehab PT Goals PT Goal Formulation: Patient unable to participate in goal setting Time For Goal Achievement: 2 weeks Pt will go Supine/Side to Sit: with modified independence PT Goal: Supine/Side to Sit - Progress: Progressing toward goal Pt will go Sit to Supine/Side: with modified independence PT Goal: Sit to Supine/Side - Progress: Progressing toward goal Pt will go Sit to Stand: with supervision PT Goal: Sit to Stand - Progress: Progressing toward goal Pt will go Stand to Sit: with supervision PT Goal: Stand to Sit - Progress: Progressing toward goal Pt will Ambulate: 51 - 150 feet;with least restrictive assistive device;with supervision PT Goal: Ambulate - Progress: Progressing toward goal  PT Treatment Precautions/Restrictions  Precautions Precautions: Fall Restrictions Weight Bearing Restrictions: No Mobility (including Balance) Bed Mobility Bed Mobility: Yes Supine to Sit: 3: Mod assist;HOB flat Supine to Sit Details (indicate cue type and reason): Mod assist to raise shoulders, pt pulling on my hand.   Sitting - Scoot to [place] of Bed: 4: Min assist Sitting - Scoot to [place] of Bed Details (indicate cue type and reason): assist to adavnce hips due to weakness Sit to Supine: 5:  Supervision Transfers Transfers: Yes Sit to Stand: Patient percentage (comment);From bed;From chair/3-in-1;3: Mod assist (Pt 80%) Sit to Stand Details (indicate cue type and reason): Verbal and tactile cues for hand placement. Assist for anterior wt shift and to stabilize pt to stand.  Stand to Sit: 4: Min assist;To bed;To chair/3-in-1;With armrests;With upper extremity assist Stand to Sit Details: cues for hand placement and controlled descent  Stand Pivot Transfers: 4: Min assist Stand Pivot Transfer Details (indicate cue type and reason): Assist to stabilze pt for balance and verbal cues for safe technique.   Ambulation/Gait Ambulation/Gait: Yes Ambulation/Gait Assistance: 2: Max assist;Patient percentage (comment) (Pt 30%) Ambulation/Gait Assistance Details (indicate cue type and reason): Manual facilitation, verbal and tactile cues for upright posture.  Assist to steer walker, repeated cues to decrease distance from walker.  Two trials.  First trial pt ambulated 30 feet second trial 60 feet.   Ambulation Distance (Feet): 90 Feet (30 trial 1; 60 trial 2) Assistive device: Rolling walker Gait Pattern: Step-to pattern;Decreased step length - left;Decreased weight shift to left;Trunk flexed;Lateral trunk lean to right Stairs: No Wheelchair Mobility Wheelchair Mobility: No  Posture/Postural Control Posture/Postural Control: Postural limitations Postural Limitations: flexed trunk posture Balance Balance Assessed: Yes Static Sitting Balance Static Sitting - Balance Support: Bilateral upper extremity supported;Feet supported Static Sitting - Level of Assistance: 5: Stand by assistance Static Sitting - Comment/# of Minutes: sat on EOB for 3+ min while preparing for transfers.  C/o mild dizziness which improved after about a minute.  Exercise    End of Session PT - End of Session Equipment Utilized During Treatment: Gait belt Activity Tolerance: Patient limited by fatigue Patient left:  in chair;with call bell in reach Nurse Communication: Mobility status for  transfers;Mobility status for ambulation General Behavior During Session: [facility] for tasks performed Cognition: Impaired  Kandi Brusseau 10/12/2011, 6:47 PM Keilon Ressel L. Erlean Mealor DPT [phone]

## 2011-10-12 NOTE — Progress Notes (Signed)
Pt alert and oriented to self and place. No restraints in use this shift. Behavior has been calm with episodes of aggression x 1. IM Geodon administered and effective.

## 2011-10-12 NOTE — Progress Notes (Signed)
Patient ID: Jared [name]    ZOX:[id number]    DOB: [birth year]    DOA: 09/29/2011  PCP: Lonia Blood, MD, MD  Subjective: Patient calm cooperative this morning at the time of my encounter, off restraints for the first time, had just received Geodon.   Objective: Weight change:   Intake/Output Summary (Last 24 hours) at 10/12/11 1835 Last data filed at 10/12/11 0700  Gross per 24 hour  Intake    480 ml  Output      0 ml  Net    480 ml   Blood pressure 176/87, pulse 69, temperature 98.7 F (37.1 C), temperature source Oral, resp. rate 20, height 5\' 5"  (1.651 m), weight 78 kg (171 lb 15.3 oz), SpO2 98.00%.  Physical Exam: Patient  refused the examination  Lab Results: Basic Metabolic Panel:  Lab 10/10/11 8119 10/08/11 0914  NA 138 137  K 4.7 4.5  CL 94* 95*  CO2 23 25  GLUCOSE 119* 87  BUN 61* 53*  CREATININE 11.06* 10.97*  CALCIUM 9.7 9.1  MG -- --  PHOS 6.8* --   Liver Function Tests:  Lab 10/10/11 0731 10/08/11 0914  AST -- --  ALT -- --  ALKPHOS -- --  BILITOT -- --  PROT -- --  ALBUMIN 3.3* 3.1*   CBC:  Lab 10/10/11 0731 10/08/11 0914  WBC 7.6 6.0  NEUTROABS -- --  HGB 11.3* 10.7*  HCT 33.4* 31.3*  MCV 97.7 97.5  PLT 240 241    CBG:  Lab 10/10/11 1725 10/08/11 1328  GLUCAP 72 64*     Micro Results: No results found for this or any previous visit (from the past 240 hour(s)).  Studies/Results: Ct Head Wo Contrast  09/29/2011    IMPRESSION: Chronic small vessel disease and volume loss. No acute intracranial abnormality.  Original Report Authenticated By: Harley Hallmark, M.D.   Mr Brain Wo Contrast  10/03/2011   IMPRESSION: No visible acute stroke.  Severe atrophy with advanced chronic microvascular ischemic change.  Study limitations as described above.  Original Report Authenticated By: Elsie Stain, M.D.   Dg Chest Port 1 View  09/29/2011  *RADIOLOGY REPORT*  Clinical Data: Altered mental status.  PORTABLE CHEST - 1 VIEW  Comparison:  03/30/2011.  Findings: Progressive enlargement of the cardiac silhouette with a mild increase in prominence of the pulmonary vasculature.  Stable post CABG changes.  Unremarkable bones.  IMPRESSION: Mildly progressive cardiomegaly and pulmonary vascular congestion.  Original Report Authenticated By: Darrol Angel, M.D.    Medications: Scheduled Meds:    . amLODipine  10 mg Oral Daily  . aspirin  81 mg Oral Daily  . calcium acetate  2,001 mg Oral TID WC  . cinacalcet  30 mg Oral Q breakfast  . clopidogrel  75 mg Oral Q breakfast  . famotidine  20 mg Oral Daily  . insulin glargine  5 Units Subcutaneous Once  . labetalol  300 mg Oral BID  . levothyroxine  88 mcg Oral Q0600  . LORazepam  1 mg Intramuscular Once  . multivitamin  1 tablet Oral QHS  . niacin  500 mg Oral Daily  . paricalcitol  8 mcg Intravenous Q T,Th,Sa-HD  . sodium chloride  3 mL Intravenous Q12H  . Travoprost (BAK Free)  1 drop Both Eyes BH-q7a  . ziprasidone  60 mg Intramuscular Daily   Continuous Infusions:    Assessment/Plan:   Principal Problem:  *Altered mental status: Likely secondary to dementia with  agitation, MRI shows no stroke but patient has severe atrophy with chronic microvascular changes worsened with hospital acquired delirium/psychosis and possible underlying psychiatric illness  -Placed on risperidone, then Geodon IM per psychiatry recommendation however he refuses to take the medication.  - But patient continues to refuse all medications. (Per Dr. Ferol Luz, Geodon can be repeated every 8 hours when necessary for agitation and combativeness. If patient notes an EPS symptoms, start on Cogentin 0.5 mg IM BID PRN) - Patient may be more appropriate for [facility] geriatrics psych facility but apparently denied because of dialysis. - Also called psychiatry to reevaluate patient again, keep off the restraints as much possible   Active Problems:  ESRD (end stage renal disease) on dialysis  - nephrology  following   Hyperkalemia  - per nephrology recommendations   ANEMIA, CHRONIC  - remains stable   HYPERTENSION  - stable   DVT prophylaxis: SCDs  Code Status: Full code  Disposition: [name] [phone]).  I called patient's daughter today however she did not pick up my phone, left detailed message to call me back. I am not sure if he can manage at rental home without 24/7 supervision by family/caregiver especially with his dementia and combative behavior.    LOS: 13 days   Rasheedah Reis M.D. [facility] 10/12/2011, 6:35 PM Pager: [phone]
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
## 2011-10-13 ENCOUNTER — Inpatient Hospital Stay (HOSPITAL_COMMUNITY): Payer: Medicare Other

## 2011-10-13 LAB — CBC
HCT: 31.4 % — ABNORMAL LOW (ref 39.0–52.0)
Hemoglobin: 10.8 g/dL — ABNORMAL LOW (ref 13.0–17.0)
MCHC: 34.4 g/dL (ref 30.0–36.0)
WBC: 10.1 10*3/uL (ref 4.0–10.5)

## 2011-10-13 LAB — RENAL FUNCTION PANEL
BUN: 80 mg/dL — ABNORMAL HIGH (ref 6–23)
Chloride: 93 mEq/L — ABNORMAL LOW (ref 96–112)
Glucose, Bld: 94 mg/dL (ref 70–99)
Potassium: 5.2 mEq/L — ABNORMAL HIGH (ref 3.5–5.1)

## 2011-10-13 MED ORDER — PARICALCITOL 5 MCG/ML IV SOLN
INTRAVENOUS | Status: AC
  Filled 2011-10-13: qty 2

## 2011-10-13 NOTE — Progress Notes (Signed)
Clinical social worker has not been able to reach pt daughter. Per discussion with pt MD, MD has not been able to reach pt daughter. CSW spoke with pt niece who lives locally and has been Tour manager csw. Pt niece stated that she is willing to help but would like pt family to make the decision. Pt niece and csw discussed that family is not returning phone calls and if pt daughter is unwilling to participate in finding placement for pt, pt niece is the next of kin that has been identified. Pt niece agreed to assist with snf process if she is unable to reach pt daughter this evening. CSW and pt niece to discuss further tomorrow. CSW is still awaiting psych evaluation to determine if pt has capacity at this time after medication management has proven successful with pt behaviors.   .Clinical social worker continuing to follow pt to assist with pt dc plans and further csw needs.   Catha Gosselin, Theresia Majors  [phone] .10/13/2011 16:51pm

## 2011-10-13 NOTE — Progress Notes (Signed)
Patient refused to let staffs touch him for any health assessment at this time.  He refused to have his vital signs checked.  He is alert and oriented x3, but seems irritated and continues to be verbally aggressive.  We will try again later.

## 2011-10-13 NOTE — Progress Notes (Signed)
Problem/Plan:  1. Dementia 2. HTN/volume - controlled; cont same meds and UF with HD; will need new EDW outpt    Disposition- per primary services; as noted # 1, behavior major problem--improved  Currently receiving hemodialysis.  Stable hemodynamics.  Green light.  Tolerating treatment   Calm.  Alert and apppropriate Abdomen soft No edema

## 2011-10-13 NOTE — Consult Note (Signed)
Reason for Consult: Capacity evaluation Referring Physician: Dr. Ardith Dark is an 78 y.o. male.  HPI: Patient was seen, chart reviewed and case discussed with the staff. Patient was suffering with multiple medical conditions including end-stage renal disease. Patient was unable to recognize his medical problems severe to the medical problems medication needs. Patient feels that staff nurses are giving shots forcefully which he does not want. Patient seems to be confused about his medical care and does not remember or recognize in becoming and making him. His family has served limited support and contact with the hospital at this time. Hospital social worker trying to contact the extended family members who provide appropriate assistance. Patient received the antipsychotic medication for agitation and aggressive behavior which he responded positively.  Mental status exam: Patient appeared in his hospital bed with the IV line and ahead and was elevated. Patient is calm and poorly corporative this evaluation. Patient does not answer the questions but asking questions and evaluated. He knows his name and he knows he was in the hospital. He was unable to contact any family members and asked for his current medical needs. Patient has no suicidal or homicidal ideation or acute psychotic symptoms.  Past Medical History  Diagnosis Date  . Psychosexual dysfunction with inhibited sexual excitement   . Other specified personal history presenting hazards to health   . Generalized osteoarthrosis, unspecified site   . Osteoarthrosis, unspecified whether generalized or localized, unspecified site   . 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 Unspecified hypothyroidism   . Gout   . CAD (coronary artery disease)   . Unspecified deficiency anemia   . Unspecified essential hypertension   . End stage renal disease   . Myocardial infarction 1964  . Back pain     Past  Surgical History  Procedure Date  . Cabg     1994  . Av fistula placement   . Coronary artery bypass graft     Family History  Problem Relation Age of Onset  . Heart disease Mother   . Heart disease Father   . Heart disease Sister     Social History:  reports that he quit smoking about 50 years ago. His smoking use included Cigarettes. He quit after 15 years of use. He has never used smokeless tobacco. He reports that he does not drink alcohol or use illicit drugs.  Allergies: No Known Allergies  Medications: I have reviewed the patient's current medications.  Results for orders placed during the hospital encounter of 09/29/11 (from the past 48 hour(s))  CBC     Status: Abnormal   Collection Time   10/13/11  7:52 AM      Component Value Range Comment   WBC 10.1  4.0 - 10.5 (K/uL)    RBC 3.27 (*) 4.22 - 5.81 (MIL/uL)    Hemoglobin 10.8 (*) 13.0 - 17.0 (g/dL)    HCT 16.1 (*) 09.6 - 52.0 (%)    MCV 96.0  78.0 - 100.0 (fL)    MCH 33.0  26.0 - 34.0 (pg)    MCHC 34.4  30.0 - 36.0 (g/dL)    RDW 04.5  40.9 - 81.1 (%)    Platelets 226  150 - 400 (K/uL)   RENAL FUNCTION PANEL     Status: Abnormal   Collection Time   10/13/11  7:52 AM      Component Value Range Comment   Sodium 136  135 - 145 (  mEq/L)    Potassium 5.2 (*) 3.5 - 5.1 (mEq/L)    Chloride 93 (*) 96 - 112 (mEq/L)    CO2 22  19 - 32 (mEq/L)    Glucose, Bld 94  70 - 99 (mg/dL)    BUN 80 (*) 6 - 23 (mg/dL)    Creatinine, Ser 78.29 (*) 0.50 - 1.35 (mg/dL)    Calcium 9.3  8.4 - 10.5 (mg/dL)    Phosphorus 7.2 (*) 2.3 - 4.6 (mg/dL)    Albumin 3.4 (*) 3.5 - 5.2 (g/dL)    GFR calc non Af Amer 3 (*) >90 (mL/min)    GFR calc Af Amer 4 (*) >90 (mL/min)     No results found.  No depression, No anxiety and No psychosis Blood pressure 145/81, pulse 71, temperature 98.4 F (36.9 C), temperature source Oral, resp. rate 18, height 5\' 5"  (1.651 m), weight 77.1 kg (169 lb 15.6 oz), SpO2 98.00%.   Assessment/Plan: Patient was  incapacitated to make medical decisions and living arrangements. Recommend extended family members to obtain power of attorney for his medical needs and living arrangements Patient does not require acute psychiatric hospitalization. No further medication recommended at this time Thank you for psychiatric consultation, may contact the psychiatric consult services as needed for further assistance.  Gabriana Wilmott,JANARDHAHA R. 10/13/2011, 8:24 PM

## 2011-10-13 NOTE — Progress Notes (Signed)
Utilization review completed.  

## 2011-10-13 NOTE — Progress Notes (Signed)
Clinical social worker confirmed with [facility] that pt will be seen by psychiatry today regarding capacity. Per discussion with medical team, pt is agreeable to snf at this time and is not requiring restraints at this time. CSW to contact pt daughter and niece to follow up with patient care plan. .Clinical social worker continuing to follow pt to assist with pt dc plans and further csw needs.   Catha Gosselin, Theresia Majors  [phone] .10/13/2011 11:57am

## 2011-10-13 NOTE — Progress Notes (Signed)
Patient ID: Jared [name]    ZOX:[id number]    DOB: 02/27/1934    DOA: 09/29/2011  PCP: Jared Blood, MD, MD  Hospitalization course Feb 27- October 13, 2011/ INTERVAL SUMMARY  Briefly, patient is a 78 year old male with history of CAD, ESRD on HD, HTN, was brought to the ED as he was found to be increasingly confused. Patient was not able to provide any history and family was unreachable. His labs had shown hyperkalemia, patient was admitted for workup of altered mental status. MRI brain was done which showed no stroke but patient has severe atrophy with chronic microvascular changes and likely he has dementia worsened with hospital acquired psychosis/delirium and possible underlying psychiatry illness. Psychiatry service was consulted, patient was seen by Dr. Ferol [name] who recommended initially starting risperidone by mouth, patient however continued to refuse all the medications. Patient has been very uncooperative, threatening towards the staff and caregivers. Subsequently patient was placed on IM Geodon daily (with instructions to repeat every 8 hours PRN, for agitation and combativeness). He was placed in restraints, which was discontinued yesterday. Patient has been more calm and cooperative since starting the Geodon. I have also requested psychiatry to reevaluate him now since he is off restraints. He has been closely followed by renal service for hemodialysis. Physical therapy has evaluated the patient and recommended skilled nursing facility. Patient is currently in a rental place (not a facility), I have been trying to contact his daughter, Jared [name], in [place] every day for last 2 days to help [place] with disposition. Will update social worker to start SNF search today. He is declined by [facility] gero-psych facility due to end-stage renal disease and need for hemodialysis.  Subjective: Patient calm cooperative this morning seen in HD, off restraints for 24 hours, Geodon working well per Jared [name] staff     Objective: Weight change:   Intake/Output Summary (Last 24 hours) at 10/13/11 0819 Last data filed at 10/13/11 0600  Gross per 24 hour  Intake    240 ml  Output      0 ml  Net    240 ml   [name] pressure 78/54, pulse 65, temperature 97.6 F (36.4 C), temperature source Oral, resp. rate 18, height 5\' 5"  (1.651 m), weight 79.5 kg (175 lb 4.3 oz), SpO2 100.00%.  Physical Exam:  Gen: alert and oriented, off restraints, on HD CVS: SIS2 clear Chest: CTA bilaterally Abd: NT, ND, NBS  Ext: No cyanosis clubbing or edema BL  Lab Results: Basic Metabolic Panel:  Lab 10/10/11 8119 10/08/11 0914  NA 138 137  K 4.7 4.5  CL 94* 95*  CO2 23 25  GLUCOSE 119* 87  BUN 61* 53*  CREATININE 11.06* 10.97*  CALCIUM 9.7 9.1  MG -- --  PHOS 6.8* --   Liver Function Tests:  Lab 10/10/11 0731 10/08/11 0914  AST -- --  ALT -- --  ALKPHOS -- --  BILITOT -- --  PROT -- --  ALBUMIN 3.3* 3.1*   CBC:  Lab 10/13/11 0752 10/10/11 0731  WBC 10.1 7.6  NEUTROABS -- --  HGB 10.8* 11.3*  HCT 31.4* 33.4*  MCV 96.0 97.7  PLT 226 240    CBG:  Lab 10/10/11 1725 10/08/11 1328  GLUCAP 72 64*     Micro Results: No results found for this or any previous visit (from the past 240 hour(s)).  Studies/Results: Ct Head Wo Contrast  09/29/2011    IMPRESSION: Chronic small vessel disease and volume loss. No acute intracranial  abnormality.  Original Report Authenticated By: Jared [name], M.D.   Mr Brain Wo Contrast  10/03/2011   IMPRESSION: No visible acute stroke.  Severe atrophy with advanced chronic microvascular ischemic change.  Study limitations as described above.  Original Report Authenticated By: Jared [name], M.D.   Dg Chest Port 1 View  09/29/2011  *RADIOLOGY REPORT*  Clinical Data: Altered mental status.  PORTABLE CHEST - 1 VIEW  Comparison: 03/30/2011.  Findings: Progressive enlargement of the cardiac silhouette with a mild increase in prominence of the pulmonary vasculature.   Stable post CABG changes.  Unremarkable bones.  IMPRESSION: Mildly progressive cardiomegaly and pulmonary vascular congestion.  Original Report Authenticated By: Jared [name], M.D.    Medications: Scheduled Meds:    . amLODipine  10 mg Oral Daily  . aspirin  81 mg Oral Daily  . calcium acetate  2,001 mg Oral TID WC  . cinacalcet  30 mg Oral Q breakfast  . clopidogrel  75 mg Oral Q breakfast  . famotidine  20 mg Oral Daily  . insulin glargine  5 Units Subcutaneous Once  . labetalol  300 mg Oral BID  . levothyroxine  88 mcg Oral Q0600  . LORazepam  1 mg Intramuscular Once  . multivitamin  1 tablet Oral QHS  . niacin  500 mg Oral Daily  . paricalcitol  8 mcg Intravenous Q T,Th,Sa-HD  . sodium chloride  3 mL Intravenous Q12H  . Travoprost (BAK Free)  1 drop Both Eyes BH-q7a  . ziprasidone  60 mg Intramuscular Daily   Continuous Infusions:    Assessment/Plan:   Principal Problem:  *Altered mental status: Likely secondary to dementia with agitation, MRI shows no stroke but patient has severe atrophy with chronic microvascular changes worsened with hospital acquired delirium/psychosis and possible underlying psychiatric illness. Significantly improved after starting Geodon  - Continue Geodon daily, keep off restraints as much possible (Per Dr. Ferol [name], Geodon can be repeated every 8 hours when necessary for agitation and combativeness. If patient notes an EPS symptoms, start on Cogentin 0.5 mg IM BID PRN) - [facility] because of dialysis.   Active Problems:  ESRD (end stage renal disease) on dialysis  - nephrology following   Hyperkalemia  - per nephrology recommendations   ANEMIA, CHRONIC  - remains stable   HYPERTENSION  - stable   DVT prophylaxis: SCDs  Code Status: Full code  Disposition: Jared [name] [phone]).  I have called patient's daughter repeatedly however she does not pick up the phone, left detailed  message again to call me back. Per PT recommendations, need skilled nursing facility. Will start SNF search today.     LOS: 14 days   Jared [name] M.D. [facility] 10/13/2011, 8:19 AM Pager: [phone]

## 2011-10-14 MED ORDER — ASPIRIN 81 MG PO TABS: 81.0000 mg | ORAL_TABLET | Freq: Every day | ORAL | Status: AC

## 2011-10-14 MED ORDER — ACETAMINOPHEN 500 MG PO TABS: 500.0000 mg | ORAL_TABLET | Freq: Four times a day (QID) | ORAL | Status: AC | PRN

## 2011-10-14 MED ORDER — CALCIUM ACETATE 667 MG PO CAPS: 2001.0000 mg | ORAL_CAPSULE | Freq: Three times a day (TID) | ORAL | Status: AC

## 2011-10-14 MED ORDER — ZIPRASIDONE HCL 60 MG PO CAPS: 60.0000 mg | ORAL_CAPSULE | Freq: Every day | ORAL | Status: AC

## 2011-10-14 NOTE — Progress Notes (Signed)
Clinical social worker spoke with pt daughter after numerous attempts and messages left at patients home. CSW has spoke with pt niece to assist with reaching pt daughter regarding pt disposition. Pt niece was able to reach patient daughter and pt daughter returned called to nurses station.   Clinical social worker spoke with pt daughter regarding the recommendations by physical therapy for patient to go to skilled nursing for rehab due to weakness that is improving. Pt daughter stated that pt uses a motorized wheelchair for mobility and does not need rehab at a nursing home.   Pt daughter expressed concerns that she has not received phone calls from pt MD. Pt daughter also expressed that she works during the day and not able to answer. When patient daughter returns phone call she gets a Engineer, technical sales and leaves messages.   CSW provided emotional support and active listening regarding patient daughter concerns and frustrations.   Pt daughter stated she would like her father to be discharged home to the care of the aid that has been providing care for patient for the past 5 years at home. Pt daughter stated she will be transferring patient to [place] where she lives when pt is able. Pt daughter stated pt is established at a dialysis center in [place] where he visits during the summer.   Pt daughter stated she would like pt to return home with aid and continue receiving dialysis while pt daughter and pt work on pt moving to [place]. Pt daughter will coordinate transfer of dialysis center when plans for move are finalized.   Pt daughter stated she would like to speak with the MD regarding medications pt has been given and the restraints that pt was put in.   CSW informed MD of patient daughter concerns and questions. Pt MD to follow up with pt daughter today.  Per discussion with MD, MD is requesting psych opinion if pt is safe to return home with aid.   Clinical social worker to follow up with pt  daughter regarding disposition plan after discussing further  with MD.   .Clinical social worker continuing to follow pt to assist with pt dc plans and further csw needs.    Catha Gosselin, Theresia Majors  [phone] .10/14/2011 11:06am

## 2011-10-14 NOTE — Progress Notes (Signed)
Pt care giver [facility], given all discharge instructions read all discharge instructions. Given pt's prescriptions to [facility]. No issues noted. Pt left in paper scrubs, he  Did not have any clothes to wear.

## 2011-10-14 NOTE — Progress Notes (Addendum)
Clinical social worker attempted to return pt daughter call at given number where csw spoke with pt daughter previously [phone]).  CSW was unable to leave a message for pt daughter. Pt daughter was aware of pt discharge home today at pt daughter request.   CSW spoke with pt aid Joann, who from previous conversation with pt daughter and pt aid, that pt plans to return home with pt aid to pt [place] home.   Per discussion with MD, pt can dc home with assistance of pt aid.    CSW and Pt aid discussed home health physical therapy, and pt aid [facility] to provide physical therapy services why patient is in [place].   CSW and Pt daughter had discussed previously the process of switching patient to [place] when plan was made for when pt would be moving. Due to CSW being unable to reach patient daughter again, pt daughter will need to discuss with patient current dialysis center and the dialysis center she wishes for pt to transfer to. CSW explained this to the pt aid who will be following up with pt daughter this afternoon, per csw and aid conversation regarding pt discharge.   Pt anticipated to dc home with assistance of pt aid. Pt aid to be at hospital at 4:00pm to assist with pt transportation home.   .Clinical social worker continuing to follow pt to assist with pt dc plans and further csw needs. CSW will continue to attempt to reach pt daughter.   Catha Gosselin, Theresia Majors  [phone] .10/14/2011 13:17pm

## 2011-10-14 NOTE — Progress Notes (Signed)
CARE MANAGEMENT NOTE 10/14/2011  Patient:  Jared [name], Jared [name]   Account Number:  [id number]  Date Initiated:  10/14/2011  Documentation initiated by:  Junius Creamer  Subjective/Objective Assessment:   adm w alt mental status     Action/Plan:   pt lives at home w assist of aid joanne [phone]. pcp dr Ellin Saba Mikeal Hawthorne.pt's da is in [place] and plans on getting pt to [place] in near future.   Anticipated DC Date:  10/15/2011   Anticipated DC Plan:  HOME W HOME HEALTH SERVICES  In-house referral  Clinical Social Worker      DC Planning Services  CM consult      [facility] Choice  HOME HEALTH   Choice offered to / List presented to:  C-4 Adult Children        HH arranged  HH-2 PT      [facility] agency  [facility]   Status of service:   Medicare Important Message given?   (If response is "NO", the following Medicare IM given date fields will be blank) Date Medicare IM given:   Date Additional Medicare IM given:    Discharge Disposition:  HOME W HOME HEALTH SERVICES  Per UR Regulation:    Comments:  3/6 this case manager was transf to this case today 3/6. pt lives at home w caregiver joanne [phone]. sw has been in contact w da in [place] who works during the day. da would like pt to return home w aid and hhc for phy ther. i was told fam wanted to use [facility] for [facility]hc. ref to [facility] [phone]. copy of hhc agency list will be left in chart. da hopes to get pt to [place] in near future. debbie Infantof Villagomez rn,bsn [phone] HOME HEALTH AGENCIES SERVING [place]   Agencies that are Medicare-Certified and are affiliated with [facility]  Telephone Number Address  [facility]   The [facility] has ownership interest in this company; however, you are under no obligation to use this agency. [phone] or  [phone] [address] [place], [place] [address]   Agencies that are Medicare-Certified and are not  affiliated with [facility] Agency Telephone Number Address  [facility] [phone] Fax [phone] [address], [address] [place], [place]  [address]  [facility] [phone] or [phone] Fax [phone] [address] [place], [place] [address]  [facility] [phone] Fax [phone] [address] [place], [place] [address]  [facility] [phone] Fax [phone] [address] [place], [place]  [address]  [facility] [phone] Fax [phone] [address], Suite [place], [place] [address]  [facility] [phone] [address] [place], [place] [address]  [facility] [phone]  [address] [place], [place] [address]  [facility] [phone] or  [phone] Fax [phone] [address] [place], [place]  [address]  [facility] [phone] Fax [phone] [address] [place], [place]  [address]  [facility]  [phone] Fax [phone] [address] [place], [place] [address]

## 2011-10-14 NOTE — Discharge Instructions (Signed)
[facility] [phone] home phy therapy

## 2011-10-14 NOTE — Progress Notes (Signed)
Subjective:  No complaints; states, "these people up here are crazy.Marland Kitchenthe Dr told my daughter I was ready to leave..and go to [place] today at 1pm"   Vital signs in last 24 hours: Filed Vitals:   10/13/11 1100 10/13/11 1204 10/13/11 1700 10/14/11 0616  BP: 92/64 107/67 145/81 135/85  Pulse: 61 67 71 70  Temp: 97.1 F (36.2 C) 97 F (36.1 C) 98.4 F (36.9 C) 98.2 F (36.8 C)  TempSrc: Oral Oral Oral Oral  Resp: 18 18 18 18   Height:      Weight: 77.1 kg (169 lb 15.6 oz)     SpO2:  100% 98% 97%   Weight change: -1.7 kg (-3 lb 12 oz)  Intake/Output Summary (Last 24 hours) at 10/14/11 0901 Last data filed at 10/13/11 2213  Gross per 24 hour  Intake    290 ml  Output   1200 ml  Net   -910 ml   Labs: Basic Metabolic Panel:  Lab 10/13/11 1610 10/10/11 0731 10/08/11 0914  NA 136 138 137  K 5.2* 4.7 4.5  CL 93* 94* 95*  CO2 22 23 25   GLUCOSE 94 119* 87  BUN 80* 61* 53*  CREATININE 13.09* 11.06* 10.97*  CALCIUM 9.3 9.7 9.1  ALB -- -- --  PHOS 7.2* 6.8* 7.1*   Liver Function Tests:  Lab 10/13/11 0752 10/10/11 0731 10/08/11 0914  AST -- -- --  ALT -- -- --  ALKPHOS -- -- --  BILITOT -- -- --  PROT -- -- --  ALBUMIN 3.4* 3.3* 3.1*   No results found for this basename: LIPASE:3,AMYLASE:3 in the last 168 hours No results found for this basename: AMMONIA:3 in the last 168 hours CBC:  Lab 10/13/11 0752 10/10/11 0731 10/08/11 0914  WBC 10.1 7.6 6.0  NEUTROABS -- -- --  HGB 10.8* 11.3* 10.7*  HCT 31.4* 33.4* 31.3*  MCV 96.0 97.7 97.5  PLT 226 240 241   Cardiac Enzymes: No results found for this basename: CKTOTAL:5,CKMB:5,CKMBINDEX:5,TROPONINI:5 in the last 168 hours CBG:  Lab 10/10/11 1725 10/08/11 1328  GLUCAP 72 64*    Iron Studies: No results found for this basename: IRON,TIBC,TRANSFERRIN,FERRITIN in the last 72 hours Studies/Results: No results found. Medications:      . amLODipine  10 mg Oral Daily  . aspirin  81 mg Oral Daily  . calcium acetate   2,001 mg Oral TID WC  . cinacalcet  30 mg Oral Q breakfast  . clopidogrel  75 mg Oral Q breakfast  . famotidine  20 mg Oral Daily  . insulin glargine  5 Units Subcutaneous Once  . labetalol  300 mg Oral BID  . levothyroxine  88 mcg Oral Q0600  . LORazepam  1 mg Intramuscular Once  . multivitamin  1 tablet Oral QHS  . niacin  500 mg Oral Daily  . paricalcitol      . paricalcitol  8 mcg Intravenous Q T,Th,Sa-HD  . sodium chloride  3 mL Intravenous Q12H  . Travoprost (BAK Free)  1 drop Both Eyes BH-q7a  . ziprasidone  60 mg Intramuscular Daily    I  have reviewed scheduled and prn medications.  Physical Exam:  General: comfortable, calm @ present  Heart:RRR  Lungs: diminished bases; prolonged expiratory phase, no R/R/W's  Abdomen: obese, soft, NT/ND, +BS  Extremities: no ankle edema  Dialysis Access: LUA AVF +T/B  Problem/Plan:  1. AMS -calm at present, but ongoing psych issues - psych re-consulted and following; now off restraints  but still refusing care  2. ESRD -TTS HD [facility]; next tx tomorrow  3. Anemia - Hgb 10.8; no ESA; follow trend  4. Secondary hyperparathyroidism - phos 7.2; cont current meds (zemplar, Phoslo, and Sensipar)  and follow trend 5. HTN/volume - controlled; cont same meds and UF with HD; will need new eDW outpt  6. Nutrition - albumin ^ 3.5; on ^ protein diet and prn suppl 7. DM- CBG and SSI per primary  8. Disposition- per primary services; unclear per patient reports "leaving for [facility]"; as noted # 1, must remain calm and cooperative for ongoing HD  Samuel Germany, FNP-C [facility] Pager [phone]  10/14/2011,9:01 AM  LOS: 15 days   Family looking at moving patient to [facility] for ongoing dialysis care.  Apparently he has been a transient patient there in the past.  There will be a fair number of logistics involved with the move as far as dialysis is concerned.  He is very alert today and approriate and speaking with is daughter via the telephone.   Next HD in am. Zita Ozimek C

## 2011-10-14 NOTE — Progress Notes (Signed)
This clinical social worker attempted to return pt daughter call at number given, [phone] numerous times since 1230pm. CSW was unable to leave a message. At the number dialed, csw received automated message with no person answering phone to attempt to locate pt daughter.   CSW attempted to call pt daughter at 12:19, 12:25, and at 4:00pm, and 4:15pm.   CSW spoke with pt MD, and CSW supervisor regarding being unable to reach pt daughter, as well as follow up psychiatric services to manage pt medications.   Per discussion with MD, and supervisor, pt can continue with plan of discharging with pt aid as already planned in previous conversations with pt daughter.   CSW was advised by supervisor and MD to give pt aid list of out patient psychiatrist for pt aid to make follow up appointment for pt and to follow up with pt daughter.   Pt has been discharged home to care of pt aid, as per previous discussion with CSW. Pt transportation to be provided by pt aid with pt discharge instructions. .No further Clinical Social Work needs, signing off.   Catha Gosselin, Theresia Majors  [phone] .10/14/2011 16:23pm

## 2011-10-14 NOTE — Progress Notes (Signed)
Patient discussed at the Long Length of Stay Jared [name] Weeks 10/14/2011  

## 2011-10-14 NOTE — Progress Notes (Signed)
Physical Therapy Treatment Patient Details Name: Jared [name] MRN: [id number] DOB: Oct 08, 1933 Today's Date: 10/14/2011  PT Assessment/Plan  PT - Assessment/Plan Comments on Treatment Session: Pt mobility continues to improve. PT Plan: Discharge plan remains appropriate;Frequency needs to be updated PT Frequency: Min 2X/week Follow Up Recommendations: Skilled nursing facility Equipment Recommended: Defer to next venue PT Goals  Acute Rehab PT Goals PT Goal Formulation: Patient unable to participate in goal setting Time For Goal Achievement: 2 weeks Pt will go Supine/Side to Sit: with modified independence Pt will go Sit to Supine/Side: with modified independence Pt will go Sit to Stand: with supervision PT Goal: Sit to Stand - Progress: Progressing toward goal Pt will go Stand to Sit: with supervision PT Goal: Stand to Sit - Progress: Progressing toward goal Pt will Ambulate: 51 - 150 feet;with least restrictive assistive device;with supervision PT Goal: Ambulate - Progress: Progressing toward goal  PT Treatment Precautions/Restrictions  Precautions Precautions: Fall Restrictions Weight Bearing Restrictions: No Mobility (including Balance) Bed Mobility Bed Mobility: No Transfers Transfers: Yes Sit to Stand: 3: Mod assist;From chair/3-in-1;With armrests;With upper extremity assist Sit to Stand Details (indicate cue type and reason): Cues for hand placement assist for wt management to extend Bilateral LEs and trunk.  Stand to Sit: 4: Min assist;To chair/3-in-1;With upper extremity assist Stand to Sit Details: cues for hand placement and controlled descent .  Stand Pivot Transfers: Not tested (comment) Ambulation/Gait Ambulation/Gait: Yes Ambulation/Gait Assistance: 3: Mod assist Ambulation/Gait Assistance Details (indicate cue type and reason): Manual facilitation, verbal and tactile cues for upright posture. Assist to steer walker, repeated cues to decrease distance from  walker Ambulation Distance (Feet): 50 Feet Assistive device: Rolling walker Gait Pattern: Step-to pattern;Decreased step length - left;Decreased weight shift to left;Trunk flexed;Lateral trunk lean to right Stairs: No Wheelchair Mobility Wheelchair Mobility: No  Posture/Postural Control Posture/Postural Control: Postural limitations Postural Limitations: flexed trunk posture Balance Balance Assessed: No Exercise    End of Session PT - End of Session Equipment Utilized During Treatment: Gait belt Activity Tolerance: Patient limited by fatigue Patient left: in chair;with call bell in reach Nurse Communication: Mobility status for transfers;Mobility status for ambulation General Behavior During Session: [facility] for tasks performed Cognition: Impaired  Jared [name] 10/14/2011, 6:05 PM Jared [name] [place] DPT [phone]

## 2011-10-14 NOTE — Plan of Care (Signed)
Sopke extensively with Jacen Carlini (patient's daughter in [place]).  She verbalized several times that she is angry about the current situatoin with her father.  She was called by Mr. Rivenburg niece Kathie Rhodes today with the news that he we were pursuing SNF placement for her father. She is adamantly against this.  She was also angry that she had left several messages with the attending physician but had not actually spoken with him yet.  Ms. Washabaugh stated that according to Gasper Sells (his home health attendant for 5 years from [facility]) her father "has been at his baseline mental status" and was "unclear about why he was being held here against his will".  She also said his niece Kathie Rhodes expressed that he did not appear to be confused.    After two hours of discussing these issues with Ms. Auld (and Gasper Sells who I called as well), she was much calmer and asked me to document the following: "My father will NOT be discharged to a nursing home. He WILL be discharged home into Joann's care until this weekend. I will be coming to get him this weekend to take him back to [place] with me. I would like assistance in getting him set up at [facility] in [place], [place]"  I apologized for this stressful experience and she thanked me for the help.  I will pass this on the CM/ SW team.

## 2011-10-14 NOTE — Progress Notes (Signed)
Late Entry ~ 1200 10/14/11 In patients room to assist patient with answering phone. Pts. daughter on the phone, requesting information including: phone number to [facility]; Also requesting information regarding the process for getting patients information/medical records transferred to a center in the [place]. Information provided to [name]. Jared [name], Jared [name]

## 2011-10-14 NOTE — Discharge Summary (Signed)
Patient ID: Jared [name] MRN: [id number] DOB/AGE: [id number] 76 y.o.  Admit date: 09/29/2011 Discharge date: 10/14/2011  Primary Care Physician:  Lonia Blood, MD, MD   Discharge Diagnoses:    Present on Admission:  .Altered mental status .ESRD (end stage renal disease) on dialysis .ANEMIA, CHRONIC .CAD .HYPOTHYROIDISM .HYPERTENSION .Hyperkalemia   Medication List  As of 10/14/2011  3:37 PM   STOP taking these medications         HYDROcodone-acetaminophen 5-500 MG per tablet      LORazepam 1 MG tablet      metoprolol succinate 50 MG 24 hr tablet         TAKE these medications         acetaminophen 500 MG tablet   Commonly known as: TYLENOL   Take 1 tablet (500 mg total) by mouth every 6 (six) hours as needed for pain.      amLODipine 10 MG tablet   Commonly known as: NORVASC   Take 10 mg by mouth daily.      aspirin 81 MG tablet   Take 1 tablet (81 mg total) by mouth daily.      calcium acetate 667 MG capsule   Commonly known as: PHOSLO   Take 3 capsules (2,001 mg total) by mouth 3 (three) times daily with meals.      calcium elemental as carbonate 400 MG tablet   Commonly known as: BARIATRIC TUMS ULTRA   Chew 1,000 mg by mouth daily as needed. For heartburn      DIALYVITE PO   Take 1 tablet by mouth daily.      famotidine 20 MG tablet   Commonly known as: PEPCID   Take 20 mg by mouth daily.      guaiFENesin 600 MG 12 hr tablet   Commonly known as: MUCINEX   Take 600 mg by mouth 2 (two) times daily as needed. For cough & congestion      HYDROcodone-acetaminophen 10-325 MG per tablet   Commonly known as: NORCO   Take 1 tablet by mouth every 6 (six) hours as needed. pain      labetalol 300 MG tablet   Commonly known as: NORMODYNE   Take 300 mg by mouth 2 (two) times daily.      Levothyroxine Sodium 88 MCG Caps   Take 1 capsule by mouth daily.      multivitamin tablet   Take 1 tablet by mouth daily.      naproxen sodium 220 MG tablet   Commonly  known as: ANAPROX   Take 220 mg by mouth 2 (two) times daily.      niacin 500 MG CR tablet   Commonly known as: NIASPAN   Take 500 mg by mouth daily.      nitroGLYCERIN 0.4 MG/SPRAY spray   Commonly known as: NITROLINGUAL   Place 1 spray under the tongue every 5 (five) minutes as needed. For chest pain      PLAVIX 75 MG tablet   Generic drug: clopidogrel   Take 75 mg by mouth daily.      SENSIPAR 30 MG tablet   Generic drug: cinacalcet   Take 30 mg by mouth daily.      TRAVATAN Z 0.004 % ophthalmic solution   Generic drug: travoprost (benzalkonium)   Place 1 drop into both eyes every morning.      ziprasidone 60 MG capsule   Commonly known as: GEODON   Take 1 capsule (60 mg total) by mouth at  bedtime.             Consults: Renal,psychiatry   Significant Diagnostic Studies:  Ct Head Wo Contrast  09/29/2011  *RADIOLOGY REPORT*  Clinical Data: 78 year old male with altered mental status. Dialysis.  Involuntary movements.  CT HEAD WITHOUT CONTRAST  Technique:  Contiguous axial images were obtained from the base of the skull through the vertex without contrast.  Comparison: 09/10/2010 and earlier.  Findings: Degenerative changes at C1-C2. Visualized paranasal sinuses and mastoids are clear.  No acute osseous abnormality identified.  Visualized orbit soft tissues are within normal limits.  Calcified atherosclerosis.  Stable scalp soft tissues.  Stable cerebral volume.  Stable ventriculomegaly.  Confluent white matter hypodensity has not significantly changed.  Chronic right thalamic lacunar infarct appears stable. No midline shift, mass effect, or evidence of mass lesion.  No acute intracranial hemorrhage identified.  No evidence of cortically based acute infarction identified.  IMPRESSION: Chronic small vessel disease and volume loss. No acute intracranial abnormality.  Original Report Authenticated By: Harley Hallmark, M.D.   Mr Brain Wo Contrast  10/03/2011  *RADIOLOGY REPORT*   Clinical Data: Altered mental status.  MRI HEAD WITHOUT CONTRAST  Technique:  Multiplanar, multiecho pulse sequences of the brain and surrounding structures were obtained according to standard protocol without intravenous contrast.  Comparison: CT head 09/29/2011  Findings: The patient had difficulty remaining motionless for the study.  Images are suboptimal.  Small or subtle lesions could be overlooked.  Only axial diffusion, axial T2, and axial FLAIR sequences were obtained.  There is no visible acute stroke, intracranial hemorrhage, mass lesion, hydrocephalus, or extra-axial fluid.  There is severe atrophy with advanced chronic microvascular ischemic change.  Flow voids are maintained in the carotid and basilar arteries.  IMPRESSION: No visible acute stroke.  Severe atrophy with advanced chronic microvascular ischemic change.  Study limitations as described above.  Original Report Authenticated By: Elsie Stain, M.D.   Dg Chest Port 1 View  09/29/2011  *RADIOLOGY REPORT*  Clinical Data: Altered mental status.  PORTABLE CHEST - 1 VIEW  Comparison: 03/30/2011.  Findings: Progressive enlargement of the cardiac silhouette with a mild increase in prominence of the pulmonary vasculature.  Stable post CABG changes.  Unremarkable bones.  IMPRESSION: Mildly progressive cardiomegaly and pulmonary vascular congestion.  Original Report Authenticated By: Darrol Angel, M.D.    Brief H and P: For complete details please refer to admission H and P, but in brief  78 year old male with history of CAD status post CABG, ESRD on hemodialysis on Tuesday Thursdays and Saturdays, hypertension chronic anemia was brought into the ER because patient was found to be increasingly confused. We do not know how long he has been confused. In the ER he is not found to be in any acute distress or pain. His labs showed hyperkalemia for which he was given Kayexalate. Repeat metabolic panel will be done now. I do not his baseline mental  status. When asked if he is hurting anywhere he says no. He's not short of breath not having nausea vomiting or abdominal pain. He just states that he did get some laxative to have bowel movements.    Hospital Course:  Briefly, patient is a 78 year old male with history of CAD, ESRD on HD, HTN, was brought to the ED as he was found to be increasingly confused. Patient was not able to provide any history and family was unreachable. His labs had shown hyperkalemia, patient was admitted for workup of altered mental status.  MRI brain was done which showed no stroke but patient has severe atrophy with chronic microvascular changes and likely he has dementia worsened with hospital acquired psychosis/delirium and possible underlying psychiatry illness. Psychiatry service was consulted, patient was seen by Dr. Ferol Luz who recommended initially starting risperidone by mouth, patient however continued to refuse all the medications. Patient has been very uncooperative, threatening towards the staff and caregivers. Subsequently patient was placed on IM Geodon daily (with instructions to repeat every 8 hours PRN, for agitation and combativeness). He was placed in restraints, which was subsequently discontinued . Patient has been more calm and cooperative since starting the Geodon. . He has been closely followed by renal service for hemodialysis. Physical therapy has evaluated the patient and recommended skilled nursing facility.  daughter Keghan Mcfarren called yesterday and spoke to RN she also spoke to social worker today and declined skilled nursing facility placement and requested to discharge patient to home with his aide. I personally attempted to contact her twice today after I confirmed the phone number  [phone] with social worker however the number was for a company in [facility] in [place], social work also stated that she received the same answer  when she tried to call her again today,  surprisingly she was  able to talk to her in the same number this morning. Apparently as per chart staff was having difficulty reaching the daughter previously as well he  Principal Problem:  *Altered mental status: Likely secondary to dementia with agitation, MRI shows no stroke but patient has severe atrophy with chronic microvascular changes worsened with hospital acquired delirium/psychosis and possible underlying psychiatric illness. Significantly improved after starting Geodon  Spoke to Dr. Lolly Mustache with psych service today who cleared the patient from a psych standpoint for discharge and recommended to continue Geodon 60 mg by mouth each bedtime and to follow with psychiatry as an outpatient. Social worker was informed to arrange for followup. Active Problems:  ESRD (end stage renal disease) on dialysis  Patient was followed by the renal team during this hospitalization. Follow his hemodialysis unit as an outpatient as per routine schedule.  ANEMIA, CHRONIC  - remained stable  HYPERTENSION  - Remained stable on amlodipine and labetalol ,was also on metoprolol as outpatient which was held.  Subjective Patient seen and examined,he is very Frustrated from being here and want to go to [facility]: [place] [facility] to live with his daughter    Ceasar Mons Vitals:   10/14/11 1017  BP: 100/63  Pulse: 77  Temp: 98.1 F (36.7 C)  Resp: 18    General: Alert, awake, oriented x3, frustrated Heart: Regular rate and rhythm, without murmurs, rubs, gallops. Lungs: Clear to auscultation bilaterally. Abdomen: Soft, nontender, nondistended, positive bowel sounds. Extremities: No clubbing cyanosis or edema with positive pedal pulses. Neuro: Moving all extremities   Disposition and Follow-up:  To home with home health RN, home health PT and OT Follow his PCP in one week Follow as outpatient dialysis unit as per routine schedule  Follow with psychiatry   Time spent on Discharge: Approximately 50 minutes   Signed: Aleesha Ringstad 10/14/2011,  3:13 PM

## 2011-12-17 ENCOUNTER — Encounter: Payer: Self-pay | Admitting: Vascular Surgery

## 2011-12-18 ENCOUNTER — Ambulatory Visit: Payer: Medicare Other | Admitting: Vascular Surgery

## 2012-01-31 IMAGING — CR DG CHEST 2V
2 series · 2 of 2 positions shown · non-contrast
Comparison: Chest x-ray 07/03/2010, 03/25/2010, and chest CT
10/13/2009.

CLINICAL DATA: Shortness of breath, weakness, and cough.

CHEST - 2 VIEW

[w chest pa]
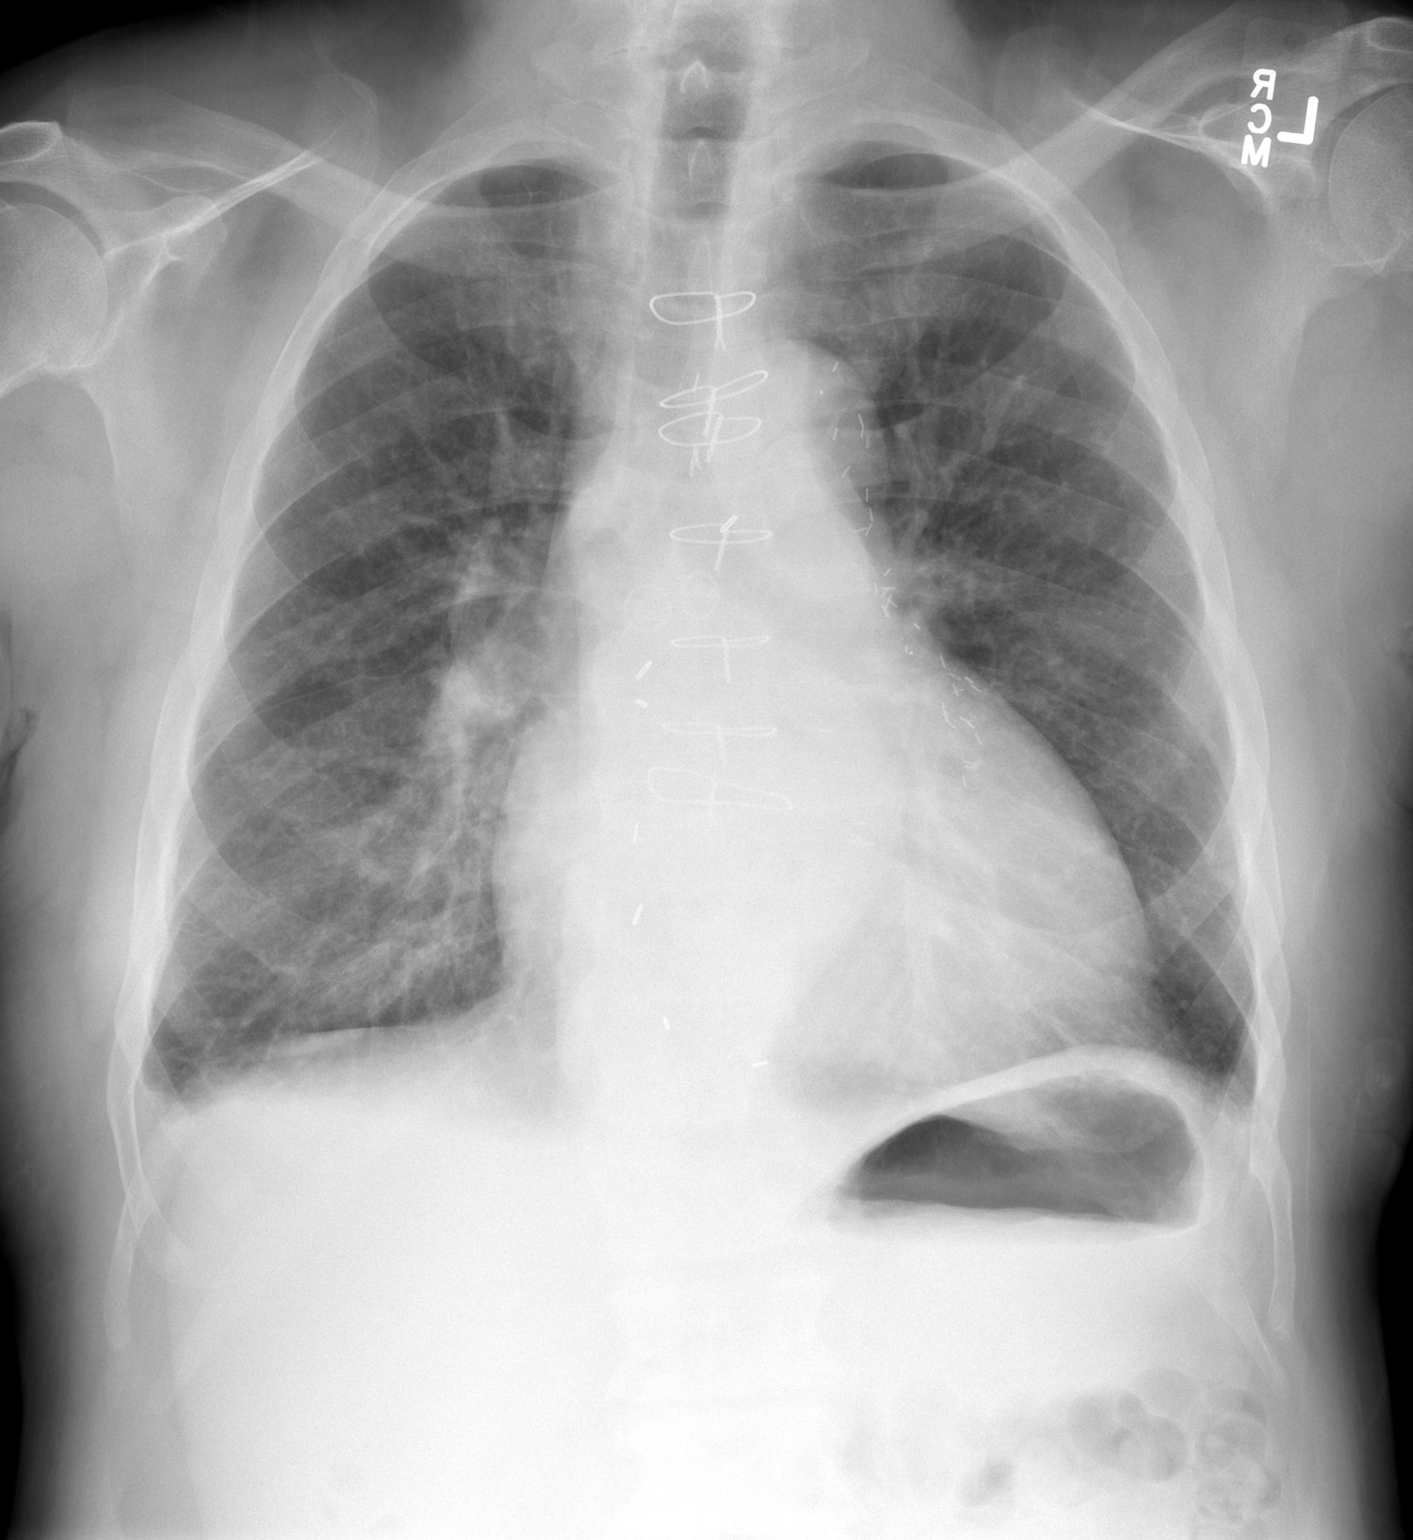

[w chest lat]
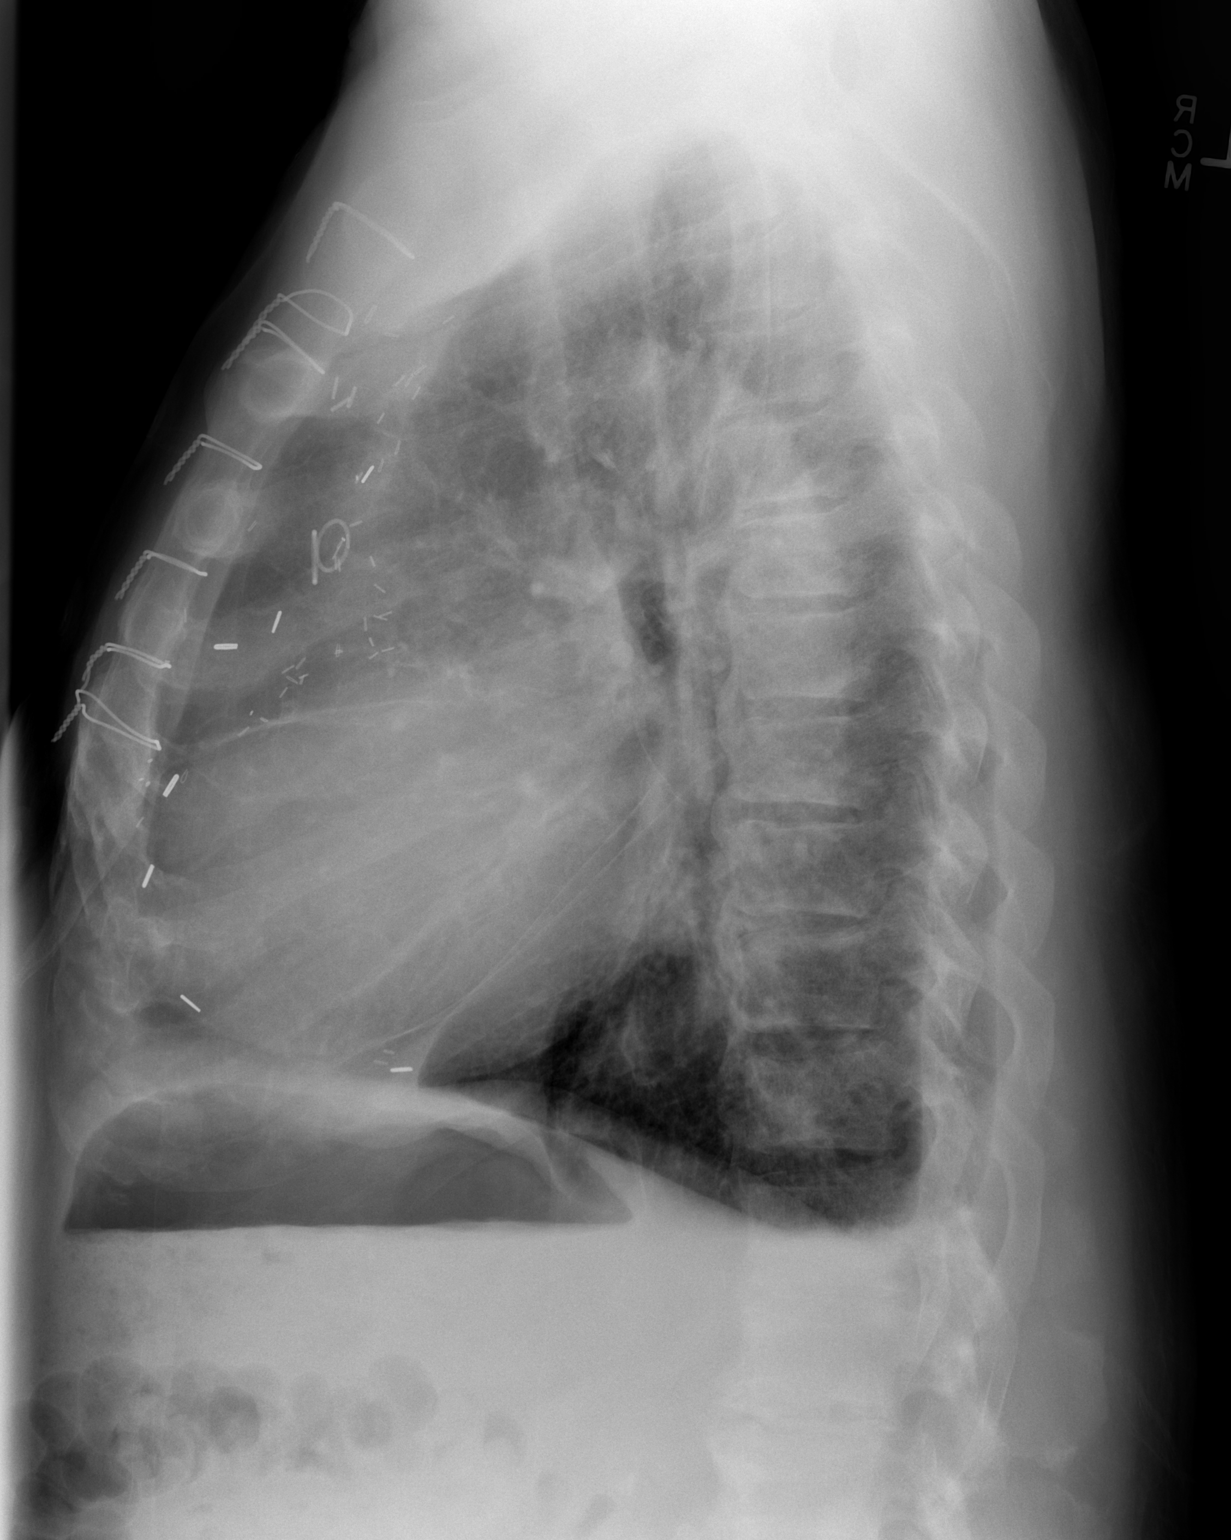

[2 of 2 positions shown; findings below may reference images not displayed]

FINDINGS: There are changes of median sternotomy for CABG.
Cardiomegaly is stable.  Pulmonary vascular congestion is present.
No diffuse or focal airspace disease is identified.  On the lateral
view, the posterior costophrenic angles are minimally blunted.
There are degenerative changes of the thoracic spine.
IMPRESSION: 1.  Cardiomegaly and pulmonary vascular congestion.  No definite
pulmonary edema or focal airspace disease.
2.  Probable trace bilateral pleural effusions.

## 2013-04-25 IMAGING — CT CT HEAD W/O CM
3 of 4 series · 17 of 30 positions shown, 19 images · non-contrast
Comparison: 09/10/2010 and earlier.

CLINICAL DATA: 77-year-old male with altered mental status.
Dialysis.  Involuntary movements.

CT HEAD WITHOUT CONTRAST
TECHNIQUE: Contiguous axial images were obtained from the base of
the skull through the vertex without contrast.

[Series 2: brain · axial · 0.47mm/px · z∈[+144,+247]mm · 5 of 32 slices shown (1 of 2)]
[im 6/32  brain]
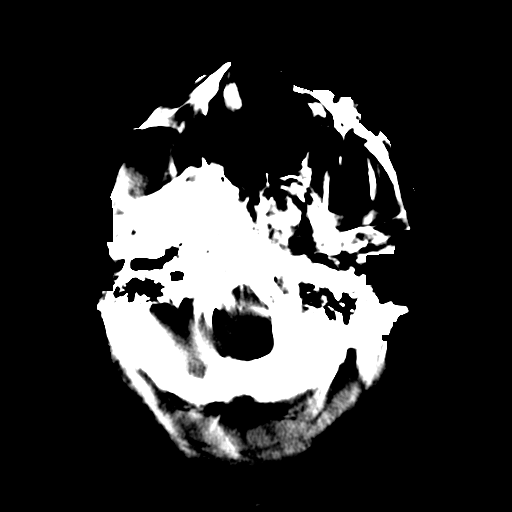
[im 11/32  brain]
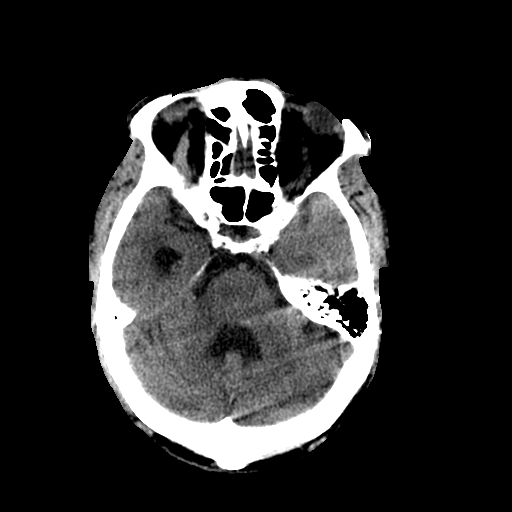
[im 16/32  brain]
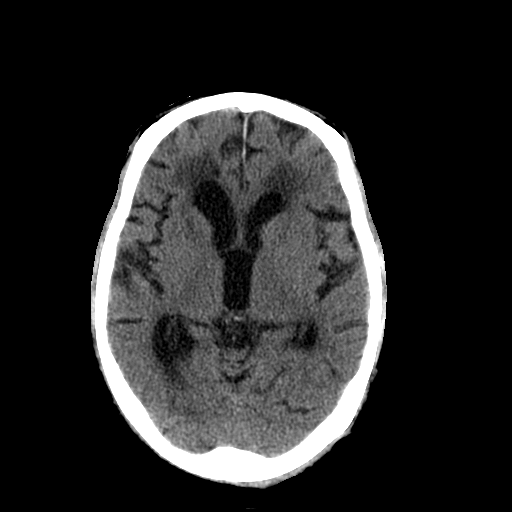
[im 21/32  brain]
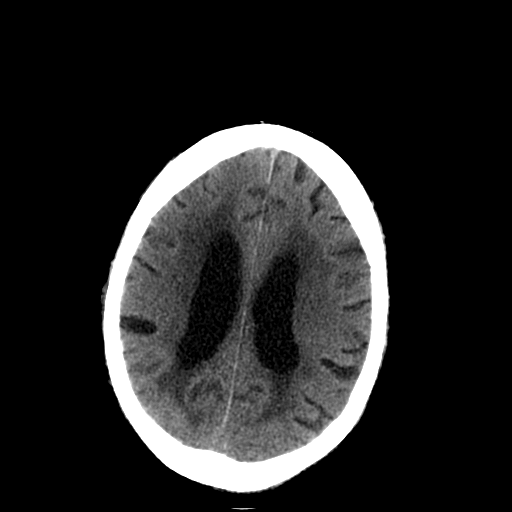
[im 26/32  brain]
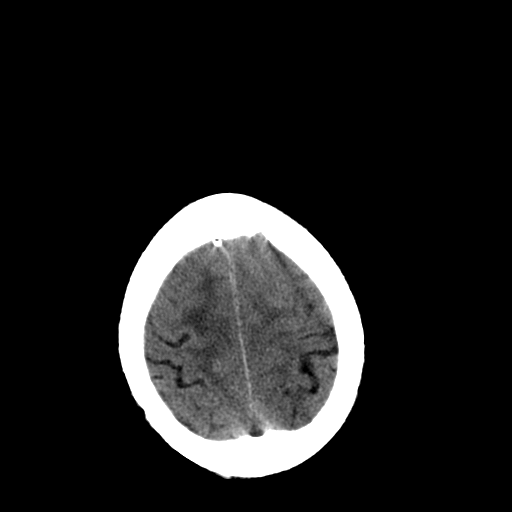

[Series 4: brain · axial · 0.47mm/px · z∈[+138,+252]mm · 6 of 32 slices shown, 8 images (2 of 2)]
[im 5/32  brain]
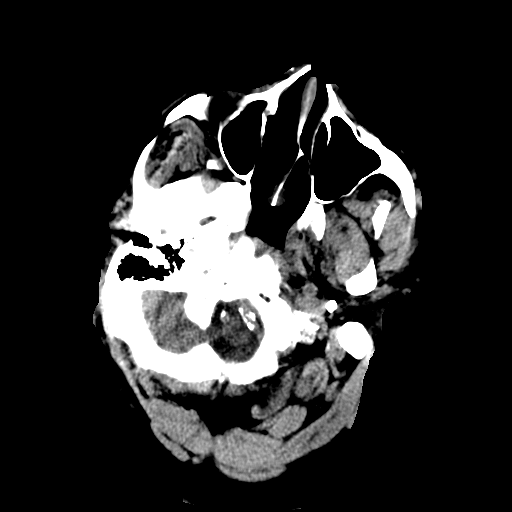
[im 5/32  bone]
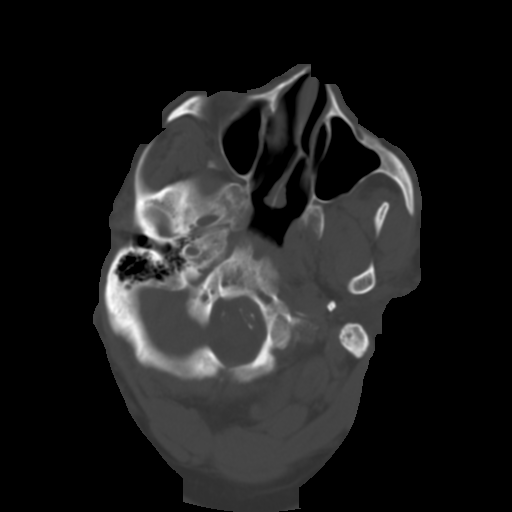
[im 9/32  brain]
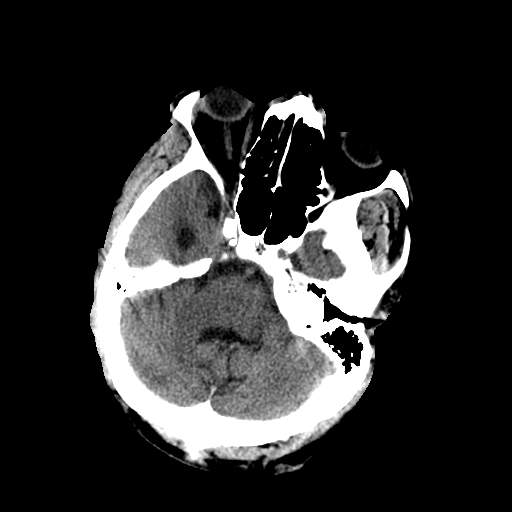
[im 14/32  brain]
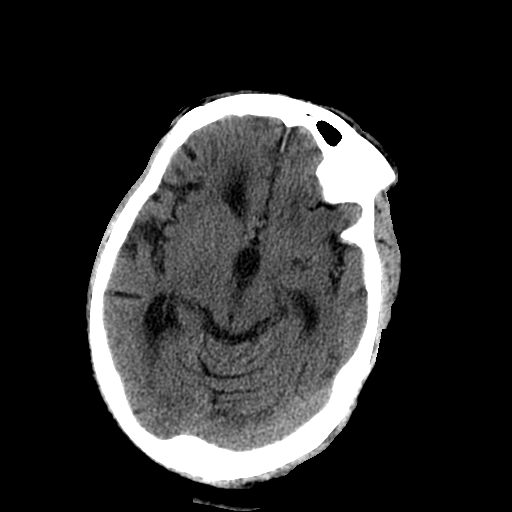
[im 18/32  brain]
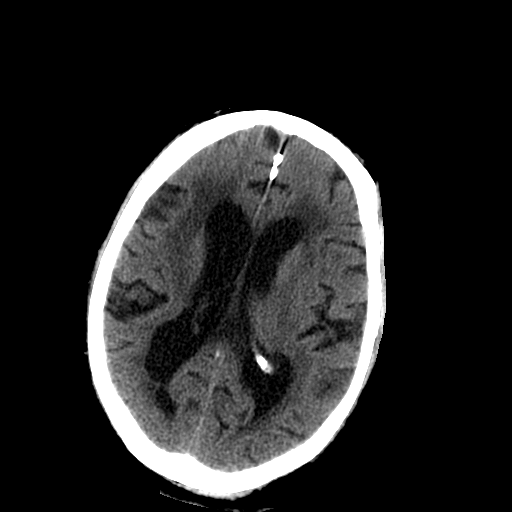
[im 23/32  brain]
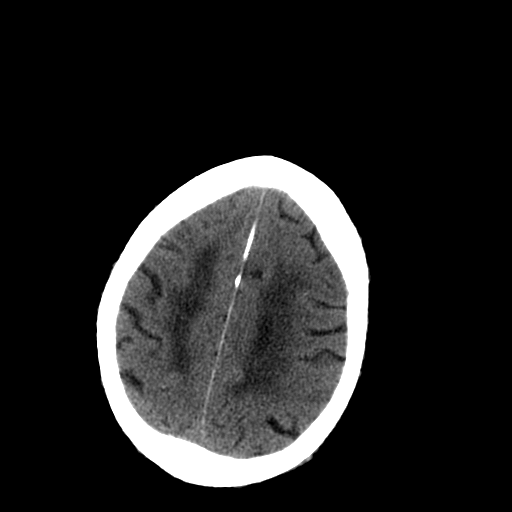
[im 23/32  bone]
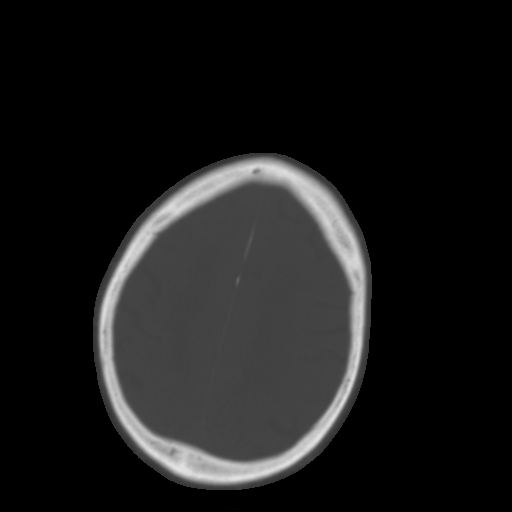
[im 27/32  brain]
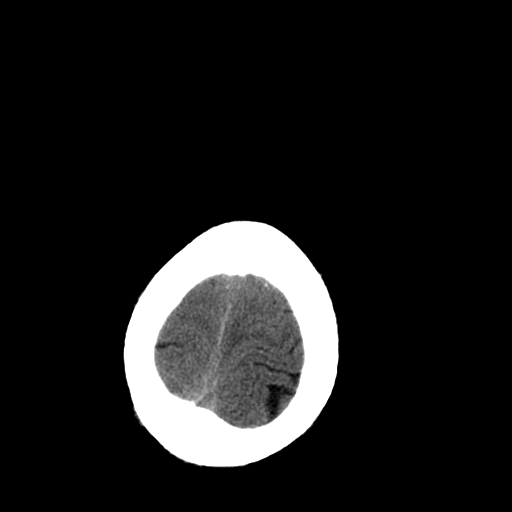

[Series 5: recon 2: brain · axial · 0.47mm/px · z∈[+138,+252]mm · 6 of 32 slices shown]
[im 5/32  brain]
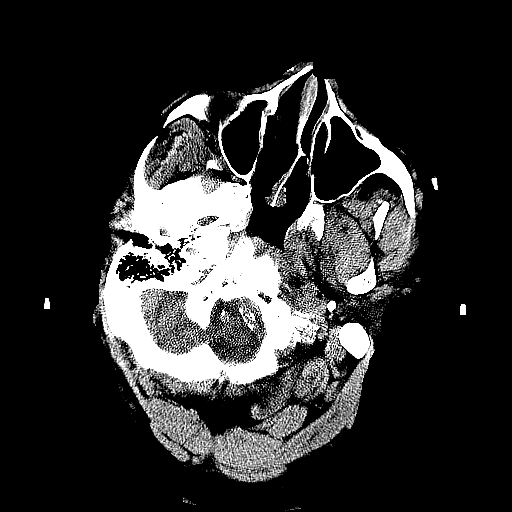
[im 9/32  brain]
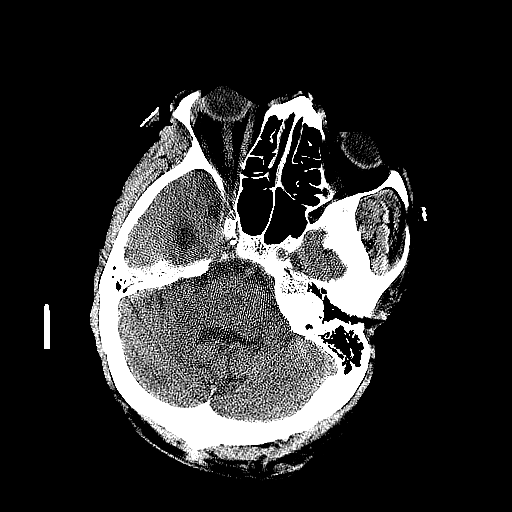
[im 14/32  brain]
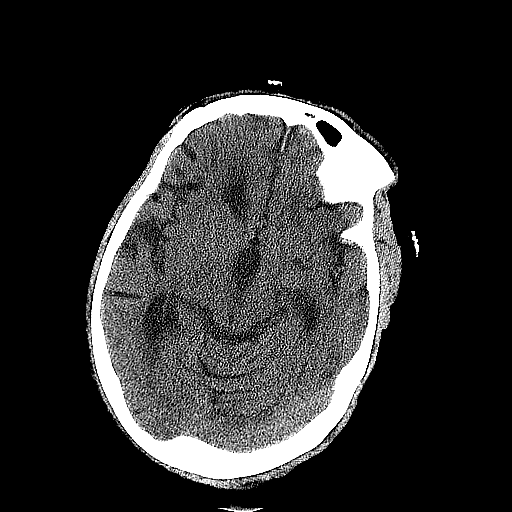
[im 18/32  brain]
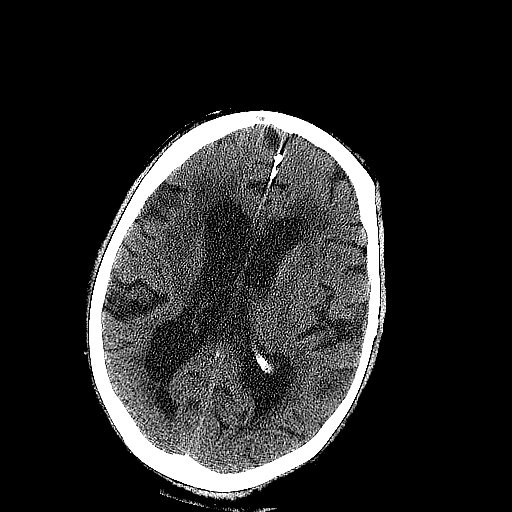
[im 23/32  brain]
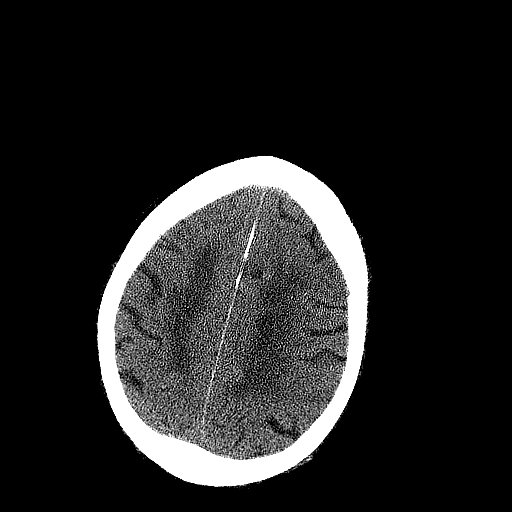
[im 27/32  brain]
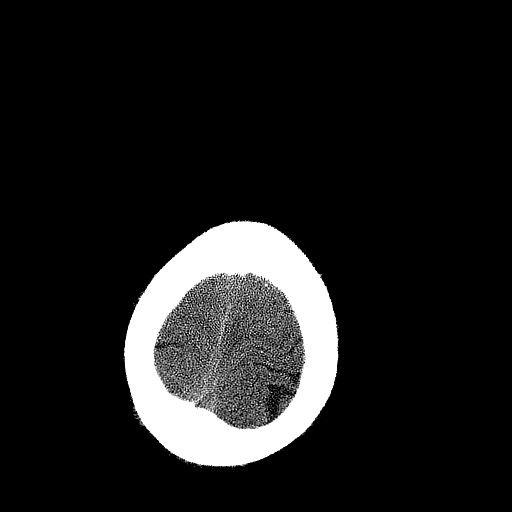

[17 of 30 positions shown; findings below may reference images not displayed]

FINDINGS: Degenerative changes at C1-C2. Visualized paranasal
sinuses and mastoids are clear.  No acute osseous abnormality
identified.  Visualized orbit soft tissues are within normal
limits.  Calcified atherosclerosis.  Stable scalp soft tissues.

Stable cerebral volume.  Stable ventriculomegaly.  Confluent white
matter hypodensity has not significantly changed.  Chronic right
thalamic lacunar infarct appears stable. No midline shift, mass
effect, or evidence of mass lesion.  No acute intracranial
hemorrhage identified.  No evidence of cortically based acute
infarction identified.
IMPRESSION: Chronic small vessel disease and volume loss. No acute intracranial
abnormality.

## 2014-07-18 ENCOUNTER — Encounter (HOSPITAL_COMMUNITY): Payer: Self-pay | Admitting: Surgery

## 2017-05-13 NOTE — Progress Notes (Signed)
This encounter was created in error - please disregard.
